# Patient Record
Sex: Female | Born: 1995 | Race: White | Hispanic: No | Marital: Married | Smoking: Never smoker
Health system: Southern US, Community
[De-identification: ages and names within clinical notes are randomized; demographics above are authoritative.]

## PROBLEM LIST (undated history)

## (undated) DIAGNOSIS — O139 Gestational [pregnancy-induced] hypertension without significant proteinuria, unspecified trimester: Secondary | ICD-10-CM

## (undated) DIAGNOSIS — R519 Headache, unspecified: Secondary | ICD-10-CM

## (undated) DIAGNOSIS — R51 Headache: Secondary | ICD-10-CM

## (undated) HISTORY — DX: Headache, unspecified: R51.9

## (undated) HISTORY — DX: Headache: R51

---
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
## 2016-03-03 ENCOUNTER — Ambulatory Visit (INDEPENDENT_AMBULATORY_CARE_PROVIDER_SITE_OTHER): Payer: 59 | Admitting: Physician Assistant

## 2016-03-03 VITALS — BP 112/68 | HR 80 | Temp 97.4°F | Resp 18 | Ht 65.0 in | Wt 160.0 lb

## 2016-03-03 DIAGNOSIS — R51 Headache: Secondary | ICD-10-CM | POA: Diagnosis not present

## 2016-03-03 DIAGNOSIS — R519 Headache, unspecified: Secondary | ICD-10-CM

## 2016-03-03 LAB — POCT CBC
Granulocyte percent: 58 %G (ref 37–80)
HCT, POC: 40.5 % (ref 37.7–47.9)
Hemoglobin: 14.1 g/dL (ref 12.2–16.2)
LYMPH, POC: 2.8 (ref 0.6–3.4)
MCH: 31.6 pg — AB (ref 27–31.2)
MCHC: 34.8 g/dL (ref 31.8–35.4)
MCV: 90.8 fL (ref 80–97)
MID (cbc): 0.6 (ref 0–0.9)
MPV: 7.6 fL (ref 0–99.8)
POC Granulocyte: 4.7 (ref 2–6.9)
POC LYMPH PERCENT: 35.1 %L (ref 10–50)
POC MID %: 6.9 % (ref 0–12)
Platelet Count, POC: 198 10*3/uL (ref 142–424)
RBC: 4.45 M/uL (ref 4.04–5.48)
RDW, POC: 13.4 %
WBC: 8.1 10*3/uL (ref 4.6–10.2)

## 2016-03-03 LAB — GLUCOSE, POCT (MANUAL RESULT ENTRY): POC Glucose: 92 mg/dl (ref 70–99)

## 2016-03-03 MED ORDER — BUTALBITAL-APAP-CAFFEINE 50-325-40 MG PO TABS: 1.0000 | ORAL_TABLET | Freq: Four times a day (QID) | ORAL | Status: AC | PRN

## 2016-03-03 NOTE — Patient Instructions (Addendum)
IF you received an x-ray today, you will receive an invoice from [facility]. Please contact [facility] at [phone] with questions or concerns regarding your invoice.   IF you received labwork today, you will receive an invoice from [employer]. Please contact [facility] at [phone] with questions or concerns regarding your invoice.   Our billing staff will not be able to assist you with questions regarding bills from these companies.  You will be contacted with the lab results as soon as they are available. The fastest way to get your results is to activate your My Chart account. Instructions are located on the last page of this paperwork. If you have not heard from [place] regarding the results in 2 weeks, please contact this office.    Please await contact for neurologist.  I will have your lab results within the next 7-10 days.  General Headache Without Cause A headache is pain or discomfort felt around the head or neck area. The specific cause of a headache may not be found. There are many causes and types of headaches. A few common ones are:  Tension headaches.  Migraine headaches.  Cluster headaches.  Chronic daily headaches. HOME CARE INSTRUCTIONS  Watch your condition for any changes. Take these steps to help with your condition: Managing Pain  Take over-the-counter and prescription medicines only as told by your health care provider.  Lie down in a dark, quiet room when you have a headache.  If directed, apply ice to the head and neck area:  Put ice in a plastic bag.  Place a towel between your skin and the bag.  Leave the ice on for 20 minutes, 2-3 times per day.  Use a heating pad or hot shower to apply heat to the head and neck area as told by your health care provider.  Keep lights dim if bright lights bother you or make your headaches worse. Eating and Drinking  Eat meals on a regular schedule.  Limit  alcohol use.  Decrease the amount of caffeine you drink, or stop drinking caffeine. General Instructions  Keep all follow-up visits as told by your health care provider. This is important.  Keep a headache journal to help find out what may trigger your headaches. For example, write down:  What you eat and drink.  How much sleep you get.  Any change to your diet or medicines.  Try massage or other relaxation techniques.  Limit stress.  Sit up straight, and do not tense your muscles.  Do not use tobacco products, including cigarettes, chewing tobacco, or e-cigarettes. If you need help quitting, ask your health care provider.  Exercise regularly as told by your health care provider.  Sleep on a regular schedule. Get 7-9 hours of sleep, or the amount recommended by your health care provider. SEEK MEDICAL CARE IF:   Your symptoms are not helped by medicine.  You have a headache that is different from the usual headache.  You have nausea or you vomit.  You have a fever. SEEK IMMEDIATE MEDICAL CARE IF:   Your headache becomes severe.  You have repeated vomiting.  You have a stiff neck.  You have a loss of vision.  You have problems with speech.  You have pain in the eye or ear.  You have muscular weakness or loss of muscle control.  You lose your balance or have trouble walking.  You feel faint or pass out.  You have confusion.  This information is not intended to replace advice given to you by your health care provider. Make sure you discuss any questions you have with your health care provider.   Document Released: 07/31/2005 Document Revised: 04/21/2015 Document Reviewed: 11/23/2014 Elsevier Interactive Patient Education [employer]2016 Elsevier Inc.

## 2016-03-03 NOTE — Progress Notes (Signed)
[facility] [address], [place] [address] [phone]  Date:  03/03/2016   Name:  Renee Mcintyre   DOB:  10-Nov-1995   MRN:  [id number]  PCP:  No PCP Per Patient   Chief Complaint  Patient presents with  . Headache    x 1 month   History of Present Illness:  Renee Mcintyre is a 20 y.o. female patient who presents to [facility] for cc of headache for 1 month.    --all day every day.  Shooting pain above her right eye.  Taking excedrin but stopped about 1 week ago because it was not working any more.  No nausea, vision changes.  She wakes up with the headache.  This is only above the right eye.  Feels like a brain freeze.  No photo/phonophobia.   No congestion--no sinus infections. Menses: regular, 5-6 days.  No hx of anemia. Diet: Maxi Bs--salads.  She has meat and vegetables daily.  Drinks water about 64oz of water per day.    --she has had headaches for about a year intermittently but this occurs all day.   --she has dizziness some times.         There are no active problems to display for this patient.   History reviewed. No pertinent past medical history.  History reviewed. No pertinent past surgical history.  Social History  Substance Use Topics  . Smoking status: Never Smoker   . Smokeless tobacco: None  . Alcohol Use: No    Family History  Problem Relation Age of Onset  . Cancer Mother     No Known Allergies  Medication list has been reviewed and updated.  No current outpatient prescriptions on file prior to visit.   No current facility-administered medications on file prior to visit.    ROS   Physical Examination: BP 112/68 mmHg  Pulse 80  Temp(Src) 97.4 F (36.3 C) (Oral)  Resp 18  Ht  (1.651 m)  Wt 160 lb (72.576 kg)  BMI 26.63 kg/m2  SpO2 97%  LMP 02/24/2016 Ideal Body Weight: Weight in (lb) to have BMI = 25: 149.9  Physical Exam  Constitutional: She is oriented to person, place, and time. She appears  well-developed and well-nourished. No distress.  HENT:  Head: Normocephalic and atraumatic.  Right Ear: External ear normal.  Left Ear: External ear normal.  Nose: No mucosal edema or rhinorrhea.  Eyes: Conjunctivae and EOM are normal. Pupils are equal, round, and reactive to light. Right eye exhibits normal extraocular motion and no nystagmus. Left eye exhibits normal extraocular motion and no nystagmus.  Cardiovascular: Normal rate.   Pulmonary/Chest: Effort normal. No respiratory distress.  Neurological: She is alert and oriented to person, place, and time. She has normal strength. No cranial nerve deficit. Coordination and gait normal.  Normal f to n, rapid hand movement.    Skin: She is not diaphoretic.  Psychiatric: She has a normal mood and affect. Her behavior is normal.    Results for orders placed or performed in visit on 03/03/16  COMPLETE METABOLIC PANEL WITH GFR  Result Value Ref Range   Sodium 139 135 - 146 mmol/L   Potassium 4.3 3.5 - 5.3 mmol/L   Chloride 103 98 - 110 mmol/L   CO2 28 20 - 31 mmol/L   Glucose, Bld 85 65 - 99 mg/dL   BUN 14 7 - 25 mg/dL   Creat 2.95 6.21 - 3.08 mg/dL   Total  Bilirubin 0.6 0.2 - 1.2 mg/dL   Alkaline Phosphatase 62 33 - 115 U/L   AST 20 10 - 30 U/L   ALT 14 6 - 29 U/L   Total Protein 7.3 6.1 - 8.1 g/dL   Albumin 4.9 3.6 - 5.1 g/dL   Calcium 9.7 8.6 - 40.910.2 mg/dL   GFR, Est African American >89 >=60 mL/min   GFR, Est Non African American >89 >=60 mL/min  TSH  Result Value Ref Range   TSH 2.44 mIU/L  Sedimentation rate  Result Value Ref Range   Sed Rate 1 0 - 20 mm/hr  POCT CBC  Result Value Ref Range   WBC 8.1 4.6 - 10.2 K/uL   Lymph, poc 2.8 0.6 - 3.4   POC LYMPH PERCENT 35.1 10 - 50 %L   MID (cbc) 0.6 0 - 0.9   POC MID % 6.9 0 - 12 %M   POC Granulocyte 4.7 2 - 6.9   Granulocyte percent 58.0 37 - 80 %G   RBC 4.45 4.04 - 5.48 M/uL   Hemoglobin 14.1 12.2 - 16.2 g/dL   HCT, POC 81.140.5 91.437.7 - 47.9 %   MCV 90.8 80 - 97 fL    MCH, POC 31.6 (A) 27 - 31.2 pg   MCHC 34.8 31.8 - 35.4 g/dL   RDW, POC 78.213.4 %   Platelet Count, POC 198 142 - 424 K/uL   MPV 7.6 0 - 99.8 fL  POCT glucose (manual entry)  Result Value Ref Range   POC Glucose 92 70 - 99 mg/dl   Assessment and Plan: Vidal Schwalbemily N Edwards is a 20 y.o. female who is here today for headache. --possible diet --given fioricet.  Advised to hydrate and eat fresh vegetables.   --will follow up with results.  --if this continues in the next week, should return. Headache, unspecified headache type - Plan: POCT CBC, POCT glucose (manual entry), COMPLETE METABOLIC PANEL WITH GFR, TSH, Sedimentation rate, butalbital-acetaminophen-caffeine (FIORICET) 50-325-40 MG tablet, CANCELED: POCT SEDIMENTATION RATE   Trena PlattStephanie Darchelle Nunes, PA-C [facility] 03/03/2016 6:23 PM

## 2016-03-04 LAB — COMPLETE METABOLIC PANEL WITH GFR
ALBUMIN: 4.9 g/dL (ref 3.6–5.1)
ALK PHOS: 62 U/L (ref 33–115)
ALT: 14 U/L (ref 6–29)
AST: 20 U/L (ref 10–30)
BILIRUBIN TOTAL: 0.6 mg/dL (ref 0.2–1.2)
BUN: 14 mg/dL (ref 7–25)
CO2: 28 mmol/L (ref 20–31)
Calcium: 9.7 mg/dL (ref 8.6–10.2)
Chloride: 103 mmol/L (ref 98–110)
Creat: 0.9 mg/dL (ref 0.50–1.10)
GFR, Est African American: >89 mL/min (ref >=60)
GFR, Est Non African American: >89 mL/min (ref >=60)
Glucose, Bld: 85 mg/dL (ref 65–99)
Potassium: 4.3 mmol/L (ref 3.5–5.3)
SODIUM: 139 mmol/L (ref 135–146)
TOTAL PROTEIN: 7.3 g/dL (ref 6.1–8.1)

## 2016-03-04 LAB — SEDIMENTATION RATE: Sed Rate: 1 mm/hr (ref 0–20)

## 2016-03-04 LAB — TSH: TSH: 2.44 mIU/L

## 2016-03-29 ENCOUNTER — Telehealth: Payer: Self-pay

## 2016-03-29 DIAGNOSIS — R519 Headache, unspecified: Secondary | ICD-10-CM

## 2016-03-29 DIAGNOSIS — R51 Headache: Principal | ICD-10-CM

## 2016-03-29 NOTE — Telephone Encounter (Signed)
Routed to English  

## 2016-03-29 NOTE — Telephone Encounter (Signed)
The patient called to check on her neurology referral for headaches.  I do not see that a referral has been placed.  She saw Trena PlattStephanie English, PA-C on 03/03/16.  Please advise, thank you.  CB#: [phone]

## 2016-03-30 NOTE — Telephone Encounter (Signed)
Please place referral today.  Thank you so much.

## 2016-03-31 NOTE — Telephone Encounter (Signed)
Please review pt labs

## 2016-04-01 NOTE — Telephone Encounter (Signed)
Normal labs were given to patient.

## 2016-04-03 ENCOUNTER — Ambulatory Visit (INDEPENDENT_AMBULATORY_CARE_PROVIDER_SITE_OTHER): Payer: 59 | Admitting: Neurology

## 2016-04-03 ENCOUNTER — Encounter: Payer: Self-pay | Admitting: Neurology

## 2016-04-03 DIAGNOSIS — R51 Headache: Secondary | ICD-10-CM | POA: Diagnosis not present

## 2016-04-03 DIAGNOSIS — G8929 Other chronic pain: Secondary | ICD-10-CM

## 2016-04-03 DIAGNOSIS — R519 Headache, unspecified: Secondary | ICD-10-CM | POA: Insufficient documentation

## 2016-04-03 MED ORDER — SUMATRIPTAN SUCCINATE 50 MG PO TABS: 50.0000 mg | ORAL_TABLET | ORAL | 6 refills | Status: DC | PRN

## 2016-04-03 MED ORDER — NORTRIPTYLINE HCL 10 MG PO CAPS: ORAL_CAPSULE | ORAL | 11 refills | Status: DC

## 2016-04-03 NOTE — Patient Instructions (Signed)
Magnesium oxide 400 mg twice a day Riboflavin  100 mg twice a day 

## 2016-04-03 NOTE — Progress Notes (Signed)
PATIENT: BRELYN WOEHL DOB: [birth year]  Chief Complaint  Patient presents with  . Headache    She is here with her mother, Cristie.  Reports having daily headaches for the last four months. She stopped using OTC NSAIDS but her headaches did not improve.  She was given Fioricet a few weeks ago but it has been only minimally helpful.  She tends to have dizziness with her headaches.     HISTORICAL  RAEL TILLY is 20 years old right-handed female, accompanied by her mother, seen in refer by primary care PA staff English for evaluation of headache, April 03 2016.  She reported a history of headaches since 2016 gradually getting worse since May 2017, her headache described as frontal, vortex area pressure headaches, as if she got a brain freeze, with intermittent sharp transient pain behind right eye, occasionally she has transient dizziness, vertigo with worsening headaches, lasting for few minutes  She was not able to identify any triggers for her headaches, she denies significant nausea, no significant light noise sensitivity.  Over the past few months she has tried warm baths, Tylenol, Advil, Excedrin Migraine with limited relief, she was given prescription of Fioricet since July, she is now taking gradual daily titrating dose, up to 4 tablets a day without improving her symptoms.  She denies focal signs such as visual loss, language difficulty lateralized motor or sensory deficit.  I reviewed laboratory evaluation in July 2017, normal ESR, CBC,TSH, CMP   REVIEW OF SYSTEMS: Full 14 system review of systems performed and notable only for headache, dizziness, sleepiness, spinning sensation, fatigue  ALLERGIES: No Known Allergies  HOME MEDICATIONS: Current Outpatient Prescriptions  Medication Sig Dispense Refill  . butalbital-acetaminophen-caffeine (FIORICET WITH CODEINE) 50-325-40-30 MG capsule Take 1 capsule by mouth every 6 (six) hours as needed for headache.     No  current facility-administered medications for this visit.     PAST MEDICAL HISTORY: Past Medical History:  Diagnosis Date  . Headache     PAST SURGICAL HISTORY: History reviewed. No pertinent surgical history.  FAMILY HISTORY: Family History  Problem Relation Age of Onset  . Cancer Mother     Breast cancer  . Healthy Father     SOCIAL HISTORY:  Social History   Social History  . Marital status: Single    Spouse name: N/A  . Number of children: 0  . Years of education: College   Occupational History  . Student   . Works in Teacher, adult education    Social History Main Topics  . Smoking status: Never Smoker  . Smokeless tobacco: Never Used  . Alcohol use No  . Drug use: No  . Sexual activity: Not on file   Other Topics Concern  . Not on file   Social History Narrative   Lives at home with parents.   Right-handed.   2 sodas per week.     PHYSICAL EXAM   Vitals:   04/03/16 1052  BP: 122/65  Pulse: 71  Weight: 170 lb 4 oz (77.2 kg)  Height: '5\' 5"'$  (1.651 m)    Not recorded      Body mass index is 28.33 kg/m.  PHYSICAL EXAMNIATION:  Gen: NAD, conversant, well nourised, obese, well groomed                     Cardiovascular: Regular rate rhythm, no peripheral edema, warm, nontender. Eyes: Conjunctivae clear without exudates or hemorrhage Neck: Supple, no carotid bruise. Pulmonary: Clear  to auscultation bilaterally   NEUROLOGICAL EXAM:  MENTAL STATUS: Speech:    Speech is normal; fluent and spontaneous with normal comprehension.  Cognition:     Orientation to time, place and person     Normal recent and remote memory     Normal Attention span and concentration     Normal Language, naming, repeating,spontaneous speech     Fund of knowledge   CRANIAL NERVES: CN II: Visual fields are full to confrontation. Fundoscopic exam is normal with sharp discs and no vascular changes. Pupils are round equal and briskly reactive to light. CN III, IV, VI: extraocular  movement are normal. No ptosis. CN V: Facial sensation is intact to pinprick in all 3 divisions bilaterally. Corneal responses are intact.  CN VII: Face is symmetric with normal eye closure and smile. CN VIII: Hearing is normal to rubbing fingers CN IX, X: Palate elevates symmetrically. Phonation is normal. CN XI: Head turning and shoulder shrug are intact CN XII: Tongue is midline with normal movements and no atrophy.  MOTOR: There is no pronator drift of out-stretched arms. Muscle bulk and tone are normal. Muscle strength is normal.  REFLEXES: Reflexes are 2+ and symmetric at the biceps, triceps, knees, and ankles. Plantar responses are flexor.  SENSORY: Intact to light touch, pinprick, positional sensation and vibratory sensation are intact in fingers and toes.  COORDINATION: Rapid alternating movements and fine finger movements are intact. There is no dysmetria on finger-to-nose and heel-knee-shin.    GAIT/STANCE: Posture is normal. Gait is steady with normal steps, base, arm swing, and turning. Heel and toe walking are normal. Tandem gait is normal.  Romberg is absent.   DIAGNOSTIC DATA (LABS, IMAGING, TESTING) - I reviewed patient records, labs, notes, testing and imaging myself where available.   ASSESSMENT AND PLAN  KRISTENE LIBERATI is a 20 y.o. female   Chronic migraine headaches  After discussed with patient, there is no focal signs, no focal complaints, she has normal neurological examination, will hold off further imaging study at this point.  I have suggested preventive medications magnesium oxide, riboflavin,  Also nortriptyline 10 mg titrating to 20 mg every night  Imitrex 25 mg as needed  Return to clinic in 3 months  Marcial Pacas, M.D. Ph.D.  [facility] [address] [place], [place] [address] Ph: [phone] Fax: [phone]  CC: Dorian Heckle [place], [place]

## 2016-07-12 ENCOUNTER — Ambulatory Visit: Payer: 59 | Admitting: Neurology

## 2017-07-30 DIAGNOSIS — Z118 Encounter for screening for other infectious and parasitic diseases: Secondary | ICD-10-CM | POA: Diagnosis not present

## 2017-07-30 DIAGNOSIS — Z01419 Encounter for gynecological examination (general) (routine) without abnormal findings: Secondary | ICD-10-CM | POA: Diagnosis not present

## 2017-07-30 DIAGNOSIS — Z131 Encounter for screening for diabetes mellitus: Secondary | ICD-10-CM | POA: Diagnosis not present

## 2017-08-02 LAB — HM PAP SMEAR: HM Pap smear: NORMAL

## 2017-09-10 ENCOUNTER — Ambulatory Visit (HOSPITAL_COMMUNITY)
Admission: EM | Admit: 2017-09-10 | Discharge: 2017-09-10 | Disposition: A | Discharge status: Home / Self Care | Payer: 59 | Attending: Family Medicine | Admitting: Family Medicine

## 2017-09-10 ENCOUNTER — Encounter (HOSPITAL_COMMUNITY): Payer: Self-pay | Admitting: Emergency Medicine

## 2017-09-10 ENCOUNTER — Ambulatory Visit (INDEPENDENT_AMBULATORY_CARE_PROVIDER_SITE_OTHER): Payer: 59

## 2017-09-10 DIAGNOSIS — R0782 Intercostal pain: Secondary | ICD-10-CM

## 2017-09-10 DIAGNOSIS — R0781 Pleurodynia: Secondary | ICD-10-CM

## 2017-09-10 MED ORDER — MELOXICAM 7.5 MG PO TABS: 7.5000 mg | ORAL_TABLET | Freq: Every day | ORAL | 0 refills | Status: DC

## 2017-09-10 NOTE — ED Provider Notes (Signed)
[facility]    CSN: [id number] Arrival date & time: 09/10/17  1456     History   Chief Complaint Chief Complaint  Patient presents with  . Rib Injury    HPI Renee Mcintyre is a 22 y.o. female.   22 year old female comes in with mother for 3-week history of left rib pain.  Patient states she had a URI symptoms for a month from mid December to mid January.  During the last week of cough, she developed left rib pain.  She states that cough has since resolved, but rib pain continued.  States symptoms worsens with deep breathing and cough.  Denies chest pain, shortness of breath, wheezing.  Denies abdominal pain, nausea, vomiting.   Denies personal or family history of blood clots.  Recently started oral birth control.  Denies swelling of the legs, long travels.       Past Medical History:  Diagnosis Date  . Headache     Patient Active Problem List   Diagnosis Date Noted  . Chronic headache 04/03/2016    History reviewed. No pertinent surgical history.  OB History    No data available       Home Medications    Prior to Admission medications   Medication Sig Start Date End Date Taking? Authorizing Provider  levonorgestrel-ethinyl estradiol (NORDETTE) 0.15-30 MG-MCG tablet Take 1 tablet by mouth daily.   Yes [provider]  spironolactone (ALDACTONE) 25 MG tablet Take 50 mg by mouth daily.   Yes [provider]  butalbital-acetaminophen-caffeine (FIORICET WITH CODEINE) 50-325-40-30 MG capsule Take 1 capsule by mouth every 6 (six) hours as needed for headache.    [provider]  meloxicam (MOBIC) 7.5 MG tablet Take 1 tablet (7.5 mg total) by mouth daily. 09/10/17   Cathie HoopsYu, Arriona Prest V, PA-C  nortriptyline (PAMELOR) 10 MG capsule 1 tablet every night for one week then 2 tablets every night 04/03/16   Levert FeinsteinYan, Yijun, MD  SUMAtriptan (IMITREX) 50 MG tablet Take 1 tablet (50 mg total) by mouth every 2 (two) hours as needed for migraine. May repeat  in 2 hours if headache persists or recurs. 04/03/16   Levert FeinsteinYan, Yijun, MD    Family History Family History  Problem Relation Age of Onset  . Cancer Mother        Breast cancer  . Healthy Father     Social History Social History   Tobacco Use  . Smoking status: Never Smoker  . Smokeless tobacco: Never Used  Substance Use Topics  . Alcohol use: Yes    Comment: occ.   . Drug use: No     Allergies   Patient has no known allergies.   Review of Systems Review of Systems  Reason unable to perform ROS: See HPI as above.     Physical Exam Triage Vital Signs ED Triage Vitals  Enc Vitals Group     BP 09/10/17 1543 136/82     Pulse Rate 09/10/17 1543 94     Resp 09/10/17 1543 16     Temp 09/10/17 1543 98.7 F (37.1 C)     Temp Source 09/10/17 1543 Oral     SpO2 09/10/17 1543 99 %     Weight 09/10/17 1544 179 lb (81.2 kg)     Height 09/10/17 1544 5\' 5"  (1.651 m)     Head Circumference --      [place] Flow --      Pain Score 09/10/17 1543 7  Pain Loc --      Pain Edu? --      Excl. in GC? --    No data found.  Updated Vital Signs BP 136/82   Pulse 94   Temp 98.7 F (37.1 C) (Oral)   Resp 16   Ht 5\' 5"  (1.651 m)   Wt 179 lb (81.2 kg)   LMP 09/01/2017   SpO2 99%   BMI 29.79 kg/m   Physical Exam  Constitutional: She is oriented to person, place, and time. She appears well-developed and well-nourished. No distress.  HENT:  Head: Normocephalic and atraumatic.  Eyes: Conjunctivae are normal. Pupils are equal, round, and reactive to light.  Neck: Normal range of motion. Neck supple.  Cardiovascular: Normal rate, regular rhythm and normal heart sounds. Exam reveals no gallop and no friction rub.  No murmur heard. Pulmonary/Chest: Effort normal and breath sounds normal. She has no wheezes. She has no rales.  No rash, erythema, contusion seen. Tenderness to palpation of anterior ribs inferior to the breast.  Abdominal: Soft. Bowel sounds are normal. She exhibits no  distension. There is no tenderness. There is no rebound and no guarding.  Musculoskeletal:  No tenderness on palpation of the back/flank. Full ROM.   Neurological: She is alert and oriented to person, place, and time.  Skin: Skin is warm and dry.     UC Treatments / Results  Labs (all labs ordered are listed, but only abnormal results are displayed) Labs Reviewed - No data to display  EKG  EKG Interpretation None       Radiology Dg Ribs Unilateral W/chest Left  Result Date: 09/10/2017 CLINICAL DATA:  Left upper anterior rib pain for 1 month.  Cough. EXAM: LEFT RIBS AND CHEST - 3+ VIEW COMPARISON:  None. FINDINGS: No fracture or other bone lesions are seen involving the ribs. There is no evidence of pneumothorax or pleural effusion. Both lungs are clear. Heart size and mediastinal contours are within normal limits. IMPRESSION: Negative. Electronically Signed   By: Charlett Nose M.D.   On: 09/10/2017 16:43    Procedures Procedures (including critical care time)  Medications Ordered in UC Medications - No data to display   Initial Impression / Assessment and Plan / UC Course  I have reviewed the triage vital signs and the nursing notes.  Pertinent labs & imaging results that were available during my care of the patient were reviewed by me and considered in my medical decision making (see chart for details).    Xray negative for fractures.  Patient with lungs clear to auscultation bilaterally without adventitious lung sounds, O2 saturation at 99%.  She is afebrile, without tachycardia or tachypnea.  Wells score of 0.  Will have patient start Mobic for pain.  Patient to follow-up with PCP if symptoms not improving.  Return precautions given.  Patient and mother expresses understanding and agrees to plan.  Final Clinical Impressions(s) / UC Diagnoses   Final diagnoses:  Rib pain on left side    ED Discharge Orders        Ordered    meloxicam (MOBIC) 7.5 MG tablet  Daily      09/10/17 1706        Belinda Fisher, PA-C 09/10/17 1710

## 2017-09-10 NOTE — ED Triage Notes (Signed)
PT reports URI symptoms from mid December to mid January. PT reports mid left rib pain that started 3 weeks ago.

## 2017-09-10 NOTE — Discharge Instructions (Signed)
X-ray negative for fracture.  Start Mobic as directed for pain.  Ice/warm compress as needed.  If symptoms not improving in the next 1-2 weeks, follow-up with PCP for reevaluation.  If noticing worsening symptoms, shortness of breath, chest pain, one-sided leg swelling with redness, go to the emergency department for further evaluation.

## 2017-11-09 DIAGNOSIS — Z0000 Encounter for general adult medical examination without abnormal findings: Secondary | ICD-10-CM | POA: Diagnosis not present

## 2017-11-21 ENCOUNTER — Encounter: Payer: Self-pay | Admitting: Physician Assistant

## 2018-09-11 DIAGNOSIS — Z0000 Encounter for general adult medical examination without abnormal findings: Secondary | ICD-10-CM | POA: Diagnosis not present

## 2018-09-11 DIAGNOSIS — Z01419 Encounter for gynecological examination (general) (routine) without abnormal findings: Secondary | ICD-10-CM | POA: Diagnosis not present

## 2018-09-11 DIAGNOSIS — E282 Polycystic ovarian syndrome: Secondary | ICD-10-CM | POA: Diagnosis not present

## 2018-09-11 LAB — BASIC METABOLIC PANEL
BUN: 9 (ref 4–21)
Creatinine: 1 (ref <=1.1)
Glucose: 97
Potassium: 5.3 (ref 3.4–5.3)
Sodium: 139 (ref 137–147)

## 2018-09-11 LAB — HEPATIC FUNCTION PANEL
ALT: 18 (ref 7–35)
AST: 15 (ref 13–35)
Alkaline Phosphatase: 76 (ref 25–125)
Bilirubin, Total: 0.2

## 2018-10-02 DIAGNOSIS — Z3041 Encounter for surveillance of contraceptive pills: Secondary | ICD-10-CM | POA: Diagnosis not present

## 2018-10-02 DIAGNOSIS — E282 Polycystic ovarian syndrome: Secondary | ICD-10-CM | POA: Diagnosis not present

## 2019-05-26 ENCOUNTER — Ambulatory Visit (INDEPENDENT_AMBULATORY_CARE_PROVIDER_SITE_OTHER): Payer: 59 | Admitting: Family Medicine

## 2019-05-26 ENCOUNTER — Encounter: Payer: Self-pay | Admitting: Family Medicine

## 2019-05-26 ENCOUNTER — Other Ambulatory Visit: Payer: Self-pay

## 2019-05-26 VITALS — BP 133/80 | HR 107 | Temp 99.0°F | Ht 65.0 in | Wt 244.8 lb

## 2019-05-26 DIAGNOSIS — N912 Amenorrhea, unspecified: Secondary | ICD-10-CM

## 2019-05-26 DIAGNOSIS — R635 Abnormal weight gain: Secondary | ICD-10-CM | POA: Diagnosis not present

## 2019-05-26 NOTE — Progress Notes (Signed)
New Patient Office Visit  Subjective:  Patient ID: Renee Mcintyre, female    DOB: 11-26-1995  Age: 23 y.o. MRN: [id number]  CC:  Chief Complaint  Patient presents with  . Establish Care  . Weight Loss    HPI Renee Mcintyre presents for concerns about weight gain-BCP pills-pt took for 2 years. Pt has been off of medication x 1 month.   Pt with no prior h/o of weight gain.  Pt has tried protein shakes, Med diet, liquid diet, increase exercise. Labwork at [facility] in [place] started for weight loss-PCOS, Spirolactone and Levothyroxine-"slightly abnormal TSH".  No prior diagnosis of thyroid disease. Pt states 100 lb weight gain over the past 3 years with more over the past year.  Pt states stress level has improved with job instead of college and married 3/20.  Pt states job is primarily sitting with exercise 52mins prior to work- home exercise program-3 days week and after work -30-45 min dance work out.  Pt states decrease exercise recently-within the last month.  03/03/16-weight 160lb, BMI 26.63. LMP(04-19-19) 1 month ago-no period last week-pt came off BCP.   Regular periods on BCP.  No nipple discharge.  No skipped periods in the past.  Weight gain noted prior to starting BCP.  Eating patterns have NOT changed other than multiple diets to try to lose weight.  Pt states salty foods her downfall-once on weekends she and here husband eat fast food. Pt cooks her own food during the week. Pt primarily bakes her food.  Pt loves veggies and salads.  Pt drinks water primarily. Occasionally tea or soda 1-2 /week.    Past Medical History:  Diagnosis Date  . Headache    Family History  Problem Relation Age of Onset  . Cancer Mother        Breast cancer  . 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 Marital status: Married    Spouse name: Not on file  . Number of children: 0  . Years of education: College  . Highest  education level: Not on file  Occupational History  . Occupation: Risk analyst  Social Needs  . Financial resource strain: Not on file  . Food insecurity    Worry: Not on file    Inability: Not on file  . Transportation needs    Medical: Not on file    Non-medical: Not on file  Tobacco Use  . Smoking status: Never Smoker  . Smokeless tobacco: Never Used  Substance and Sexual Activity  . Alcohol use: Yes    Comment: occ.   . Drug use: No  . Sexual activity: Yes  Lifestyle  . Physical activity    Days per week: Not on file    Minutes per session: Not on file  . Stress: Not on file  Relationships  . Social Herbalist on phone: Not on file    Gets together: Not on file    Attends religious service: Not on file    Active member of club or organization: Not on file    Attends meetings of clubs or organizations: Not on file    Relationship status: Not on file  . Intimate partner violence    Fear of current or ex partner: Not on file    Emotionally abused: Not on file    Physically abused: Not on file    Forced sexual activity: Not on  file  Other Topics Concern  . Not on file  Social History Narrative   Lives at home with parents.   Right-handed.   2 sodas per week.    ROS Review of Systems  Constitutional: Positive for fatigue. Negative for activity change, appetite change and fever.  HENT: Negative.   Respiratory: Negative.        Breathing harder with exercise  Cardiovascular: Negative.   Gastrointestinal: Negative.   Endocrine: Negative.  Negative for cold intolerance, heat intolerance and polyuria.  Genitourinary:       Normal pap smear   Musculoskeletal: Negative.   Skin: Negative.   Allergic/Immunologic: Negative for environmental allergies.  Neurological:       Vertigo-increase with stress-sleep helps  Hematological: Negative.   Psychiatric/Behavioral: Negative.     Objective:   Today's Vitals: BP 133/80 (BP Location: Right Arm, Patient  Position: Sitting, Cuff Size: Normal)   Pulse (!) 107   Temp 99 F (37.2 C) (Oral)   Ht 5\' 5"  (1.651 m)   Wt 244 lb 12.8 oz (111 kg)   SpO2 97%   BMI 40.74 kg/m   Physical Exam Constitutional:      Appearance: She is obese.  HENT:     Head: Normocephalic and atraumatic.     Right Ear: Tympanic membrane, ear canal and external ear normal.     Left Ear: Tympanic membrane, ear canal and external ear normal.     Nose: Nose normal.     Mouth/Throat:     Mouth: Mucous membranes are moist.  Eyes:     Conjunctiva/sclera: Conjunctivae normal.  Neck:     Musculoskeletal: Normal range of motion and neck supple.  Cardiovascular:     Rate and Rhythm: Normal rate and regular rhythm.     Pulses: Normal pulses.     Heart sounds: Normal heart sounds.  Pulmonary:     Effort: Pulmonary effort is normal.     Breath sounds: Normal breath sounds.  Abdominal:     General: Abdomen is flat.  Skin:    General: Skin is warm.     Comments: striae  Neurological:     Mental Status: She is alert and oriented to person, place, and time.  Psychiatric:        Mood and Affect: Mood normal.        Behavior: Behavior normal.     Assessment & Plan:   1. Weight gain, abnormal Obtain lab records from OB [facility]-cortisol, creatine, HCG, prolactin, TSH/T4(off medications for 1 month) Outpatient Encounter Medications as of 05/26/2019  Medication Sig  . [DISCONTINUED] butalbital-acetaminophen-caffeine (FIORICET WITH CODEINE) 50-325-40-30 MG capsule Take 1 capsule by mouth every 6 (six) hours as needed for headache.  . [DISCONTINUED] levonorgestrel-ethinyl estradiol (NORDETTE) 0.15-30 MG-MCG tablet Take 1 tablet by mouth daily.  . [DISCONTINUED] meloxicam (MOBIC) 7.5 MG tablet Take 1 tablet (7.5 mg total) by mouth daily. (Patient not taking: Reported on 05/26/2019)  . [DISCONTINUED] nortriptyline (PAMELOR) 10 MG capsule 1 tablet every night for one week then 2 tablets every night  (Patient not taking: Reported on 05/26/2019)  . [DISCONTINUED] spironolactone (ALDACTONE) 25 MG tablet Take 50 mg by mouth daily.  . [DISCONTINUED] SUMAtriptan (IMITREX) 50 MG tablet Take 1 tablet (50 mg total) by mouth every 2 (two) hours as needed for migraine. May repeat in 2 hours if headache persists or recurs. (Patient not taking: Reported on 05/26/2019)   No facility-administered encounter medications on file as of 05/26/2019.  Follow-up: Endo labwork Gerber Penza Mat Carne, MD

## 2019-05-26 NOTE — Patient Instructions (Signed)
Endo referral  Previously labwork Will call if labs required before follow up appt

## 2019-06-03 LAB — CORTISOL, FREE 24 HR URINE
Cortisol (Ur), Free: 24.1 mcg/24 h (ref 4.0–50.0)
Cortisol, Free ratio to CRT: 16.7 mcg/g creat
RESULTS RECEIVED: 1.44 g/(24.h) (ref 0.50–2.15)
Total Volume: 2300 mL

## 2019-06-03 LAB — TSH+FREE T4: TSH W/REFLEX TO FT4: 3.28 mIU/L

## 2019-06-03 LAB — HCG, SERUM, QUALITATIVE: Preg, Serum: NEGATIVE

## 2019-06-03 LAB — PROLACTIN: Prolactin: 9.2 ng/mL

## 2019-06-03 LAB — CREATININE, URINE, 24 HOUR: Creatinine, 24H Ur: 1.47 g/(24.h) (ref 0.50–2.15)

## 2019-06-10 ENCOUNTER — Encounter: Payer: Self-pay | Admitting: "Endocrinology

## 2019-06-10 ENCOUNTER — Ambulatory Visit (INDEPENDENT_AMBULATORY_CARE_PROVIDER_SITE_OTHER): Payer: 59 | Admitting: "Endocrinology

## 2019-06-10 ENCOUNTER — Other Ambulatory Visit: Payer: Self-pay

## 2019-06-10 NOTE — Patient Instructions (Signed)

## 2019-06-10 NOTE — Progress Notes (Signed)
Endocrinology Consult Note                                            06/10/2019, 5:58 PM   Subjective:    Patient ID: Renee Mcintyre, female    DOB: [birth year], PCP Corum, Minerva Fester, MD   Past Medical History:  Diagnosis Date  . Headache    History reviewed. No pertinent surgical history. Social History   Socioeconomic History  . Marital status: Married    Spouse name: Not on file  . Number of children: 0  . Years of education: College  . Highest education level: Not on file  Occupational History  . Occupation: Primary school teacher  Social Needs  . Financial resource strain: Not on file  . Food insecurity    Worry: Not on file    Inability: Not on file  . Transportation needs    Medical: Not on file    Non-medical: Not on file  Tobacco Use  . Smoking status: Never Smoker  . Smokeless tobacco: Never Used  Substance and Sexual Activity  . Alcohol use: Yes    Comment: occ.   . Drug use: No  . Sexual activity: Yes  Lifestyle  . Physical activity    Days per week: Not on file    Minutes per session: Not on file  . Stress: Not on file  Relationships  . Social Musician on phone: Not on file    Gets together: Not on file    Attends religious service: Not on file    Active member of club or organization: Not on file    Attends meetings of clubs or organizations: Not on file    Relationship status: Not on file  Other Topics Concern  . Not on file  Social History Narrative   Lives at home with parents.   Right-handed.   2 sodas per week.   Family History  Problem Relation Age of Onset  . Cancer Mother        Breast cancer  . Healthy Father    No outpatient encounter medications on file as of 06/10/2019.   No facility-administered encounter medications on file as of 06/10/2019.    ALLERGIES: No Known Allergies  VACCINATION STATUS:  There is no immunization history on file for this patient.  HPI Renee Mcintyre is 23 y.o. female who  presents today with a medical history as above. she is being seen in consultation for weight management requested by Wandra Feinstein, MD.   History is obtained directly from the patient.  She denies any prior history of endocrine dysfunction.  She reports progressive weight gain.  She admits to dietary indiscretions including consumption of large quantities of sweets, sweetened beverages, as well as sweetened fruit juices. She has not tried any weight loss measures yet.  She has seen her weight dramatically increased over the last 3 years after she finished college.  Her recent work-up is negative for Cushing's syndrome, thyroid dysfunction. She does not have family history of adrenal morbidly dysfunction.  She has thyroid problem in her grandparents. She is not particularly active. She does not have children yet, that is her plan for long-term care  Review of Systems  Constitutional: + Progressive weight gain, +fatigue, no subjective hyperthermia, no subjective hypothermia Eyes: no blurry vision, no xerophthalmia  ENT: no sore throat, no nodules palpated in throat, no dysphagia/odynophagia, no hoarseness Cardiovascular: no Chest Pain, no Shortness of Breath, no palpitations, no leg swelling Respiratory: no cough, no shortness of breath Gastrointestinal: no Nausea/Vomiting/Diarhhea Musculoskeletal: no muscle/joint aches Skin: no rashes Neurological: no tremors, no numbness, no tingling, no dizziness Psychiatric: no depression, no anxiety  Objective:    Wt 244 lb (110.7 kg)   BMI 40.60 kg/m   Wt Readings from Last 3 Encounters:  06/10/19 244 lb (110.7 kg)  05/26/19 244 lb 12.8 oz (111 kg)  09/10/17 179 lb (81.2 kg)    Physical Exam  Constitutional:  Body mass index is 40.6 kg/m.,  not in acute distress, normal state of mind Eyes: PERRLA, EOMI, no exophthalmos ENT: moist mucous membranes, no gross thyromegaly, no gross cervical lymphadenopathy Cardiovascular: normal precordial  activity, Regular Rate and Rhythm, no Murmur/Rubs/Gallops Respiratory:  adequate breathing efforts, no gross chest deformity, Clear to auscultation bilaterally Gastrointestinal: abdomen soft, Non -tender, No distension, Bowel Sounds present, no gross organomegaly Musculoskeletal: no gross deformities, strength intact in all four extremities Skin: moist, warm, no rashes, +  purplish striae on abdominal skin. Neurological: no tremor with outstretched hands, Deep tendon reflexes normal in bilateral lower extremities.  CMP ( most recent) CMP     Component Value Date/Time   NA 139 03/03/2016 1858   K 4.3 03/03/2016 1858   CL 103 03/03/2016 1858   CO2 28 03/03/2016 1858   GLUCOSE 85 03/03/2016 1858   BUN 14 03/03/2016 1858   CREATININE 0.90 03/03/2016 1858   CALCIUM 9.7 03/03/2016 1858   PROT 7.3 03/03/2016 1858   ALBUMIN 4.9 03/03/2016 1858   AST 20 03/03/2016 1858   ALT 14 03/03/2016 1858   ALKPHOS 62 03/03/2016 1858   BILITOT 0.6 03/03/2016 1858   GFRNONAA >89 03/03/2016 1858   GFRAA >89 03/03/2016 1858    Recent Results (from the past 2160 hour(s))  Cortisol, Free 24 HR Urine     Status: None   Collection Time: 05/28/19  1:08 PM  Result Value Ref Range   Total Volume 2,300 mL   Cortisol (Ur), Free 24.1 4.0 - 50.0 mcg/24 h    Comment: . Analysis performed by [employer] [facility] [employer]    Cortisol, Free ratio to CRT 16.7 mcg/g creat    Comment:                                       Reference Range:                                       ADULTS: 3.1-42.3    RESULTS RECEIVED 1.44 0.50 - 2.15 g/24 h    Comment: . This test was developed and its analytical performance characteristics have been determined by [facility]. It has not been cleared or approved by FDA. This assay has been validated pursuant to the CLIA regulations and is used for clinical purposes. .   TSH + free T4     Status: None   Collection Time: 05/28/19  1:08 PM   Result Value Ref Range   TSH W/REFLEX TO FT4 3.28 mIU/L    Comment:           Reference Range .           >  or = 20 Years  0.40-4.50 .                Pregnancy Ranges           First trimester    0.26-2.66           Second trimester   0.55-2.73           Third trimester    0.43-2.91   Prolactin     Status: None   Collection Time: 05/28/19  1:08 PM  Result Value Ref Range   Prolactin 9.2 ng/mL    Comment:             Reference Range  Females         Non-pregnant        3.0-30.0         Pregnant           10.0-209.0         Postmenopausal      2.0-20.0 . . .   Creatinine, Urine, 24 Hour     Status: None   Collection Time: 05/28/19  1:08 PM  Result Value Ref Range   Creatinine, 24H Ur 1.47 0.50 - 2.15 g/24 h  hCG, serum, qualitative     Status: None   Collection Time: 05/28/19  1:08 PM  Result Value Ref Range   Preg, Serum NEGATIVE     Comment: Reference Range Non-Pregnant: Negative Pregnant:     Positive .      Lab Results  Component Value Date   TSH 2.44 03/03/2016       Assessment & Plan:   1. Morbid obesity (HCC) Starting from December 2014:  Weight measurements include 136 pounds, 160 pounds, 170 pounds, 2044 pounds today.  Her progressive weight gain is likely due to excessive caloric intake rather than endogenous endocrine dysfunction.   She will benefit the most from dietary modification. I had a long discussion with her about steps that she can take to help her lose weight in a progressive manner. - she  admits there is a room for improvement in her diet and drink choices. -  Suggestion is made for her to avoid simple carbohydrates  from her diet including Cakes, Sweet Desserts / Pastries, Ice Cream, Soda (diet and regular), Sweet Tea, Candies, Chips, Cookies, Sweet Pastries,  Store Bought Juices, Alcohol in Excess of  1-2 drinks a day, Artificial Sweeteners, Coffee Creamer, and "Sugar-free" Products. This will help patient to have stable blood glucose  profile and potentially avoid unintended weight gain. She will return in 3 months for reevaluation.  If there is no satisfactory movement, she will be considered for Saxenda.  -Exercise regiment with longer walks is advised to the patient. -She will return in 3 months with repeat labs including thyroid function test, vitamin D.  She will also be sent to dietary consult.  - I did not initiate any new prescriptions today. - she is advised to maintain close follow up with Corum, Minerva FesterLisa L, MD for primary care needs.   - Time spent with the patient: 60 minutes, of which >50% was spent in obtaining information about her symptoms, reviewing her previous labs/studies,  evaluations, and treatments, counseling her about her morbid obesity, and developing a plan to confirm the diagnosis and long term treatment based on the latest standards of care/guidelines.    Renee CampsEmily N Rollo participated in the discussions, expressed understanding, and voiced agreement with the above plans.  All questions were answered to  her satisfaction. she is encouraged to contact clinic should she have any questions or concerns prior to her return visit.  Follow up plan: Return in about 3 months (around 09/10/2019) for Follow up with Pre-visit Labs.   Glade Lloyd, MD [facility] [address] [place], [place] [address] Phone: [phone]  Fax: [phone]     06/10/2019, 5:58 PM  This note was partially dictated with voice recognition software. Similar sounding words can be transcribed inadequately or may not  be corrected upon review.

## 2019-06-10 NOTE — Progress Notes (Signed)
Pt states she stopped birth control 2 months ago. She also states that she was on Levothyroxine (unsure of dosage) which was prescribed by OB-GYN. She stopped this 1 month ago. TSH & Free T4 done by Dr Holly Bodily were WNL.. She states she has gained approx. 80lbs over the past 3 yrs.

## 2019-06-17 ENCOUNTER — Encounter: Payer: 59 | Attending: "Endocrinology | Admitting: Nutrition

## 2019-06-17 VITALS — Ht 65.0 in | Wt 241.0 lb

## 2019-06-17 NOTE — Patient Instructions (Signed)
Goals  Follow My Plate Eat 3 balanced meals  Drink only water- 5 bottles per day Eat breakfast daily. Lose 1-2 lbs per week. Exercise: 45 minutes 3- times per week.

## 2019-06-17 NOTE — Progress Notes (Signed)
  Medical Nutrition Therapy:  Appt start time: 4081 end time:  1630.   Assessment:  Primary concerns today: Obesity and weight gain. Married and lives with husband. Is a Corporate treasurer.   Sees Dr. Dorris Fetch, Endocrinoloty. Thinks she has gained 80 lbs in the last 3 years. Eating 3 meals a day now and was eating 2 before seing Dr. Dorris Fetch. History of poor eating habits. No physically active but willing to improve.  CMP Latest Ref Rng & Units 09/11/2018 03/03/2016  Glucose 65 - 99 mg/dL - 85  BUN 4 - 21 9 14   Creatinine 0.5 - 1.1 1.0 0.90  Sodium 137 - 147 139 139  Potassium 3.4 - 5.3 5.3 4.3  Chloride 98 - 110 mmol/L - 103  CO2 20 - 31 mmol/L - 28  Calcium 8.6 - 10.2 mg/dL - 9.7  Total Protein 6.1 - 8.1 g/dL - 7.3  Total Bilirubin 0.2 - 1.2 mg/dL - 0.6  Alkaline Phos 25 - 125 76 62  AST 13 - 35 15 20  ALT 7 - 35 18 14   Wt Readings from Last 3 Encounters:  06/17/19 241 lb (109.3 kg)  06/10/19 244 lb (110.7 kg)  05/26/19 244 lb 12.8 oz (111 kg)   Ht Readings from Last 3 Encounters:  06/17/19 5\' 5"  (1.651 m)  05/26/19 5\' 5"  (1.651 m)  09/10/17 5\' 5"  (1.651 m)   Body mass index is 40.1 kg/m. @BMIFA @ Facility age limit for growth percentiles is 20 years. Facility age limit for growth percentiles is 20 years.    Physical activity:.   Preferred Learning Style  No preference indicated   Learning Readiness:  Ready  Change in progress   MEDICATIONS:    DIETARY INTAKE: .     Was eating 2 meals per day . Admits to processed food and junk food. Doesn't cook much. Ate out a lot.  Usual physical activity: ADL  Estimated energy needs: 1200 calories 135 g carbohydrates 90 g protein 33 g fat  Progress Towards Goal(s):  In progress.   Nutritional Diagnosis:  NB-1.1 Food and nutrition-related knowledge deficit As related to Obesity.  As evidenced by BMI  40.    Intervention: Nutrition and Weight loss education provided on My Plate, CHO counting, meal planning, ports,  timing of meals, avoiding snacks g medications as prescribed, benefits of exercising  60 minutes per day and prevention of DM. Stressed cutting out processed foods, fried foods and fast foods. Encouraged to cook more from home and increase fresh fruits and vegetables.   Goals  Follow My Plate Eat 3 balanced meals  Drink only water- 5 bottles per day Eat breakfast daily. Lose 1-2 lbs per week. Exercise: 45 minutes 3- times per week.  Teaching Method Utilized:  Visual Auditory Hands on  Handouts given during visit include:  The Plate Method  Meal Plan  Weight loss instructions   Barriers to learning/adherence to lifestyle change: none  Demonstrated degree of understanding via:  Teach Back   Monitoring/Evaluation:  Dietary intake, exercise, , and body weight in 1 month(s).

## 2019-07-14 ENCOUNTER — Encounter: Payer: Self-pay | Admitting: Nutrition

## 2019-07-28 ENCOUNTER — Telehealth: Payer: 59 | Admitting: Nutrition

## 2019-08-20 ENCOUNTER — Telehealth: Payer: 59 | Admitting: Nutrition

## 2019-08-21 ENCOUNTER — Other Ambulatory Visit: Payer: Self-pay

## 2019-08-21 ENCOUNTER — Ambulatory Visit: Payer: 59 | Attending: Internal Medicine

## 2019-08-21 DIAGNOSIS — Z20822 Contact with and (suspected) exposure to covid-19: Secondary | ICD-10-CM

## 2019-08-23 LAB — NOVEL CORONAVIRUS, NAA: SARS-CoV-2, NAA: NOT DETECTED

## 2019-09-04 ENCOUNTER — Other Ambulatory Visit: Payer: Self-pay

## 2019-09-04 ENCOUNTER — Ambulatory Visit: Payer: 59 | Attending: Internal Medicine

## 2019-09-04 DIAGNOSIS — Z20822 Contact with and (suspected) exposure to covid-19: Secondary | ICD-10-CM

## 2019-09-05 LAB — NOVEL CORONAVIRUS, NAA: SARS-CoV-2, NAA: NOT DETECTED

## 2019-09-10 ENCOUNTER — Ambulatory Visit: Payer: 59 | Admitting: "Endocrinology

## 2020-01-19 IMAGING — DX DG RIBS W/ CHEST 3+V*L*
3 series · 3 of 3 positions shown · non-contrast
Comparison: None.

CLINICAL DATA: Left upper anterior rib pain for 1 month.  Cough.

EXAM:
LEFT RIBS AND CHEST - 3+ VIEW

[chest pa]
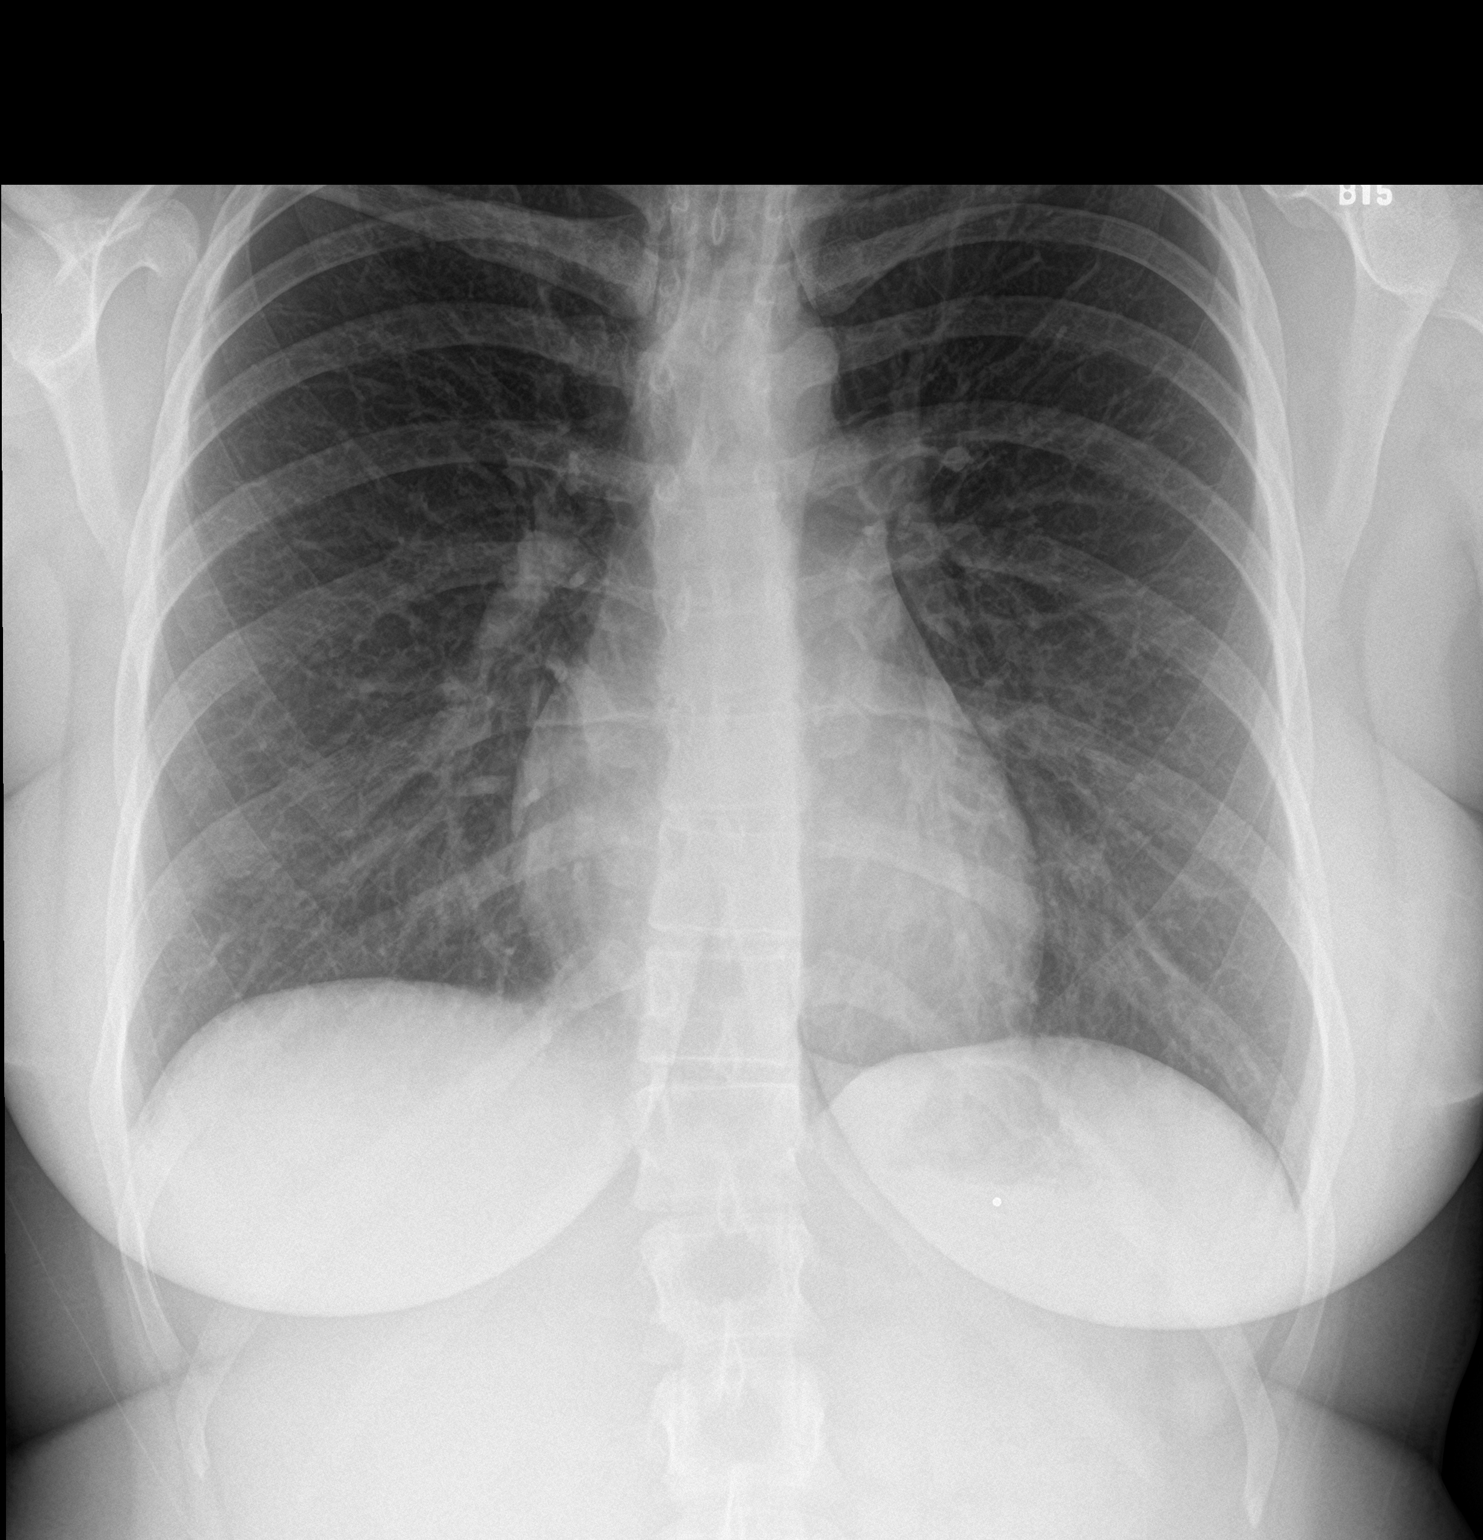

[rib obl]
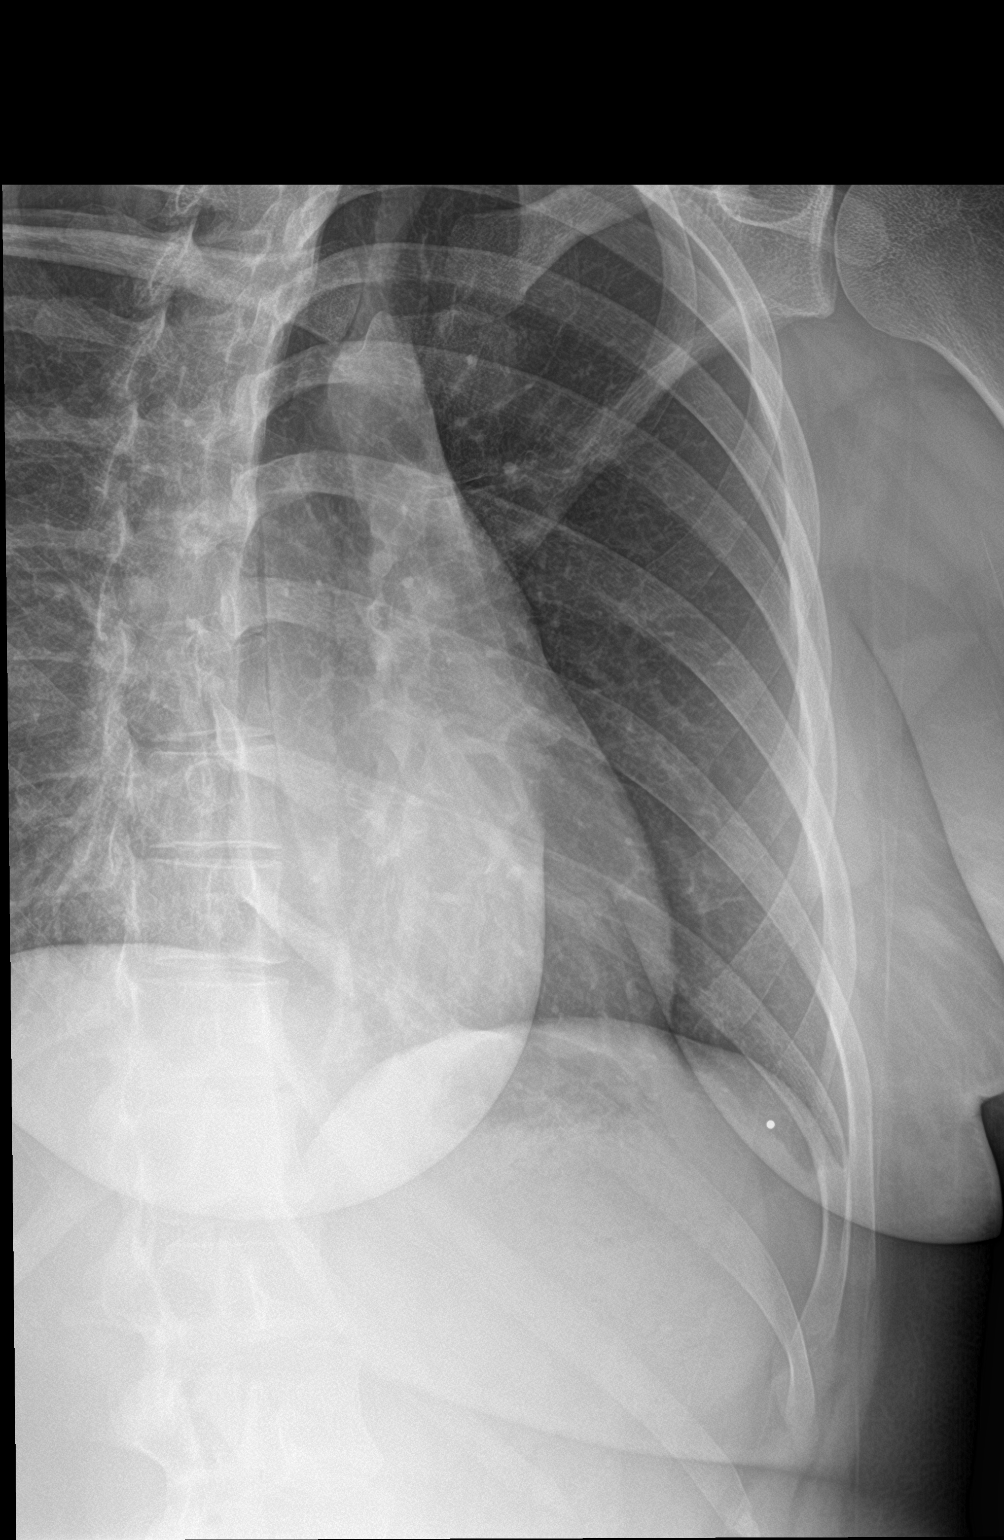

[rib pa]
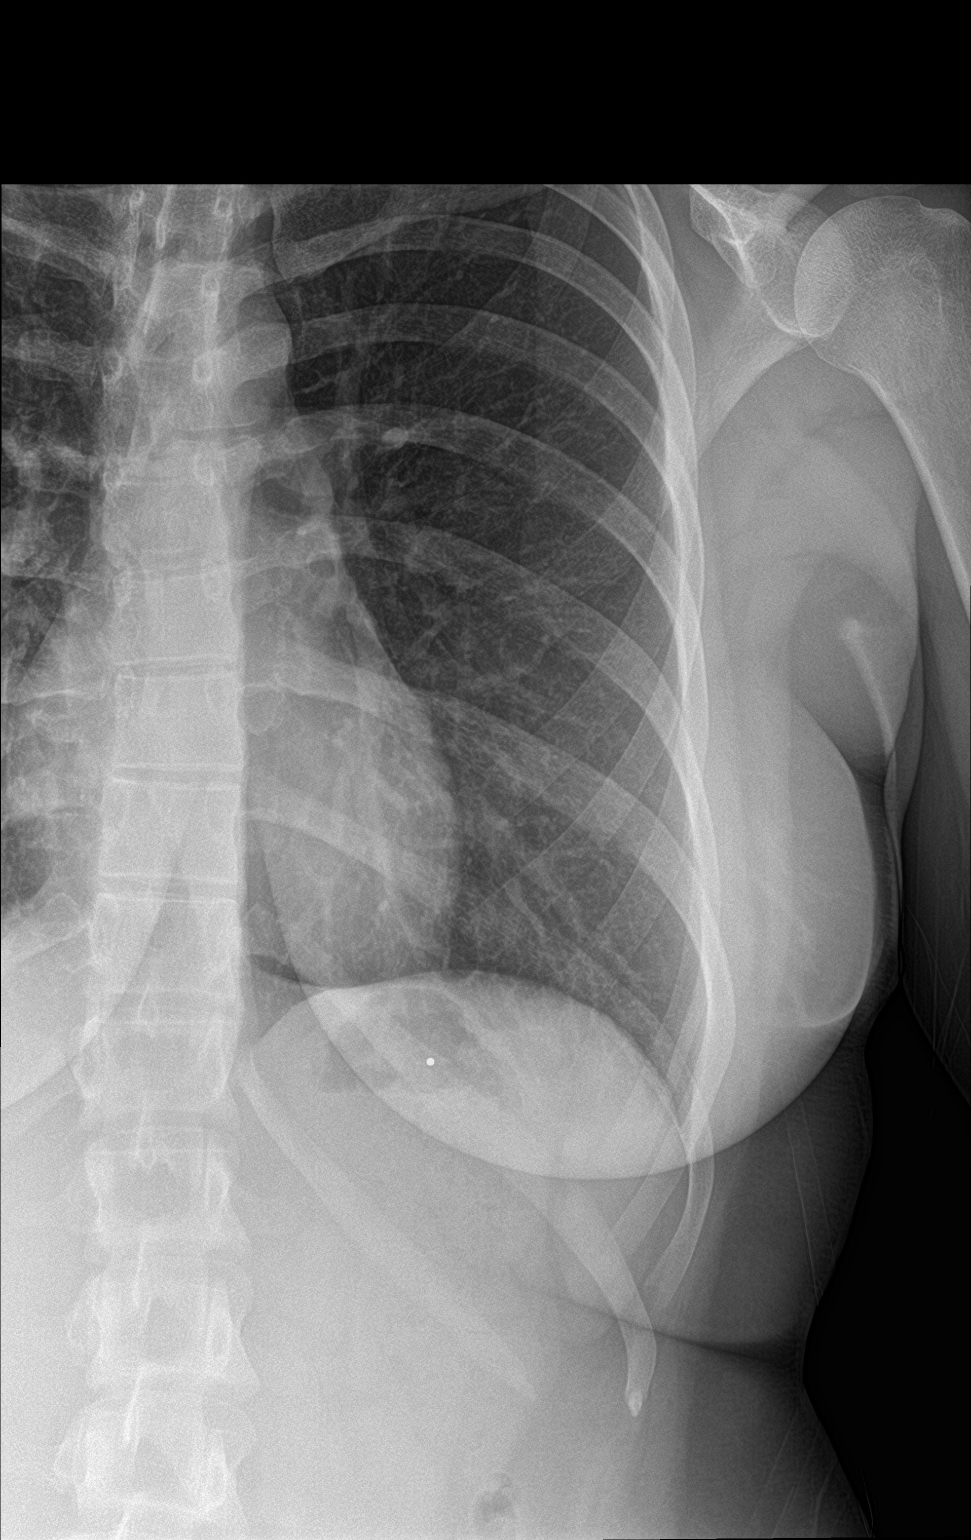

[3 of 3 positions shown; findings below may reference images not displayed]

FINDINGS: No fracture or other bone lesions are seen involving the ribs. There
is no evidence of pneumothorax or pleural effusion. Both lungs are
clear. Heart size and mediastinal contours are within normal limits.
IMPRESSION: Negative.

## 2020-11-10 ENCOUNTER — Inpatient Hospital Stay (HOSPITAL_COMMUNITY): Admission: AD | Admit: 2020-11-10 | Payer: 59 | Source: Home / Self Care | Admitting: Obstetrics

## 2021-02-11 ENCOUNTER — Encounter (HOSPITAL_COMMUNITY): Payer: Self-pay | Admitting: Obstetrics & Gynecology

## 2021-02-11 ENCOUNTER — Inpatient Hospital Stay (HOSPITAL_COMMUNITY)
Admission: AD | Admit: 2021-02-11 | Discharge: 2021-02-22 | DRG: 788 | Disposition: A | Discharge status: Home / Self Care | Payer: 59 | Attending: Obstetrics and Gynecology | Admitting: Obstetrics and Gynecology

## 2021-02-11 ENCOUNTER — Other Ambulatory Visit: Payer: Self-pay

## 2021-02-11 DIAGNOSIS — O1414 Severe pre-eclampsia complicating childbirth: Principal | ICD-10-CM | POA: Diagnosis present

## 2021-02-11 DIAGNOSIS — O99892 Other specified diseases and conditions complicating childbirth: Secondary | ICD-10-CM | POA: Diagnosis present

## 2021-02-11 DIAGNOSIS — O1413 Severe pre-eclampsia, third trimester: Secondary | ICD-10-CM | POA: Diagnosis present

## 2021-02-11 DIAGNOSIS — O99213 Obesity complicating pregnancy, third trimester: Secondary | ICD-10-CM | POA: Diagnosis not present

## 2021-02-11 DIAGNOSIS — H3500 Unspecified background retinopathy: Secondary | ICD-10-CM | POA: Diagnosis present

## 2021-02-11 DIAGNOSIS — Z23 Encounter for immunization: Secondary | ICD-10-CM

## 2021-02-11 DIAGNOSIS — R030 Elevated blood-pressure reading, without diagnosis of hypertension: Secondary | ICD-10-CM | POA: Diagnosis present

## 2021-02-11 DIAGNOSIS — Z6791 Unspecified blood type, Rh negative: Secondary | ICD-10-CM

## 2021-02-11 DIAGNOSIS — H5462 Unqualified visual loss, left eye, normal vision right eye: Secondary | ICD-10-CM | POA: Diagnosis present

## 2021-02-11 DIAGNOSIS — O26899 Other specified pregnancy related conditions, unspecified trimester: Secondary | ICD-10-CM

## 2021-02-11 DIAGNOSIS — E669 Obesity, unspecified: Secondary | ICD-10-CM | POA: Diagnosis not present

## 2021-02-11 DIAGNOSIS — O26892 Other specified pregnancy related conditions, second trimester: Secondary | ICD-10-CM | POA: Diagnosis present

## 2021-02-11 DIAGNOSIS — Z3A28 28 weeks gestation of pregnancy: Secondary | ICD-10-CM | POA: Diagnosis not present

## 2021-02-11 DIAGNOSIS — R000 Tachycardia, unspecified: Secondary | ICD-10-CM | POA: Diagnosis present

## 2021-02-11 DIAGNOSIS — O99212 Obesity complicating pregnancy, second trimester: Secondary | ICD-10-CM | POA: Diagnosis not present

## 2021-02-11 DIAGNOSIS — O99214 Obesity complicating childbirth: Secondary | ICD-10-CM | POA: Diagnosis present

## 2021-02-11 DIAGNOSIS — Z3A27 27 weeks gestation of pregnancy: Secondary | ICD-10-CM

## 2021-02-11 DIAGNOSIS — O1412 Severe pre-eclampsia, second trimester: Secondary | ICD-10-CM | POA: Diagnosis not present

## 2021-02-11 DIAGNOSIS — O283 Abnormal ultrasonic finding on antenatal screening of mother: Secondary | ICD-10-CM | POA: Diagnosis not present

## 2021-02-11 DIAGNOSIS — Z98891 History of uterine scar from previous surgery: Secondary | ICD-10-CM

## 2021-02-11 LAB — CBC
HCT: 40.3 % (ref 36.0–46.0)
Hemoglobin: 13.3 g/dL (ref 12.0–15.0)
MCH: 30.2 pg (ref 26.0–34.0)
MCHC: 33 g/dL (ref 30.0–36.0)
MCV: 91.6 fL (ref 80.0–100.0)
Platelets: 220 10*3/uL (ref 150–400)
RBC: 4.4 MIL/uL (ref 3.87–5.11)
RDW: 14.6 % (ref 11.5–15.5)
WBC: 10.8 10*3/uL — ABNORMAL HIGH (ref 4.0–10.5)
nRBC: 0 % (ref 0.0–0.2)

## 2021-02-11 LAB — COMPREHENSIVE METABOLIC PANEL
ALT: 15 U/L (ref 0–44)
AST: 18 U/L (ref 15–41)
Albumin: 2.8 g/dL — ABNORMAL LOW (ref 3.5–5.0)
Alkaline Phosphatase: 77 U/L (ref 38–126)
Anion gap: 8 (ref 5–15)
BUN: 6 mg/dL (ref 6–20)
CO2: 22 mmol/L (ref 22–32)
Calcium: 9.2 mg/dL (ref 8.9–10.3)
Chloride: 107 mmol/L (ref 98–111)
Creatinine, Ser: 0.86 mg/dL (ref 0.44–1.00)
GFR, Estimated: >60 mL/min (ref >60)
Glucose, Bld: 99 mg/dL (ref 70–99)
Potassium: 4.3 mmol/L (ref 3.5–5.1)
Sodium: 137 mmol/L (ref 135–145)
Total Bilirubin: 0.5 mg/dL (ref 0.3–1.2)
Total Protein: 5.9 g/dL — ABNORMAL LOW (ref 6.5–8.1)

## 2021-02-11 LAB — URINALYSIS, ROUTINE W REFLEX MICROSCOPIC
Bacteria, UA: NONE SEEN
Bilirubin Urine: NEGATIVE
Glucose, UA: NEGATIVE mg/dL
Ketones, ur: NEGATIVE mg/dL
Leukocytes,Ua: NEGATIVE
Nitrite: NEGATIVE
Protein, ur: 100 mg/dL — AB
Specific Gravity, Urine: 1.003 — ABNORMAL LOW (ref 1.005–1.030)
pH: 7 (ref 5.0–8.0)

## 2021-02-11 LAB — PROTEIN / CREATININE RATIO, URINE
Creatinine, Urine: 20.95 mg/dL
Protein Creatinine Ratio: 10.07 mg/mg{Cre} — ABNORMAL HIGH (ref 0.00–0.15)
Total Protein, Urine: 211 mg/dL

## 2021-02-11 MED ORDER — HYDRALAZINE HCL 20 MG/ML IJ SOLN
10.0000 mg | INTRAMUSCULAR | Status: DC | PRN
  Administered 2021-02-11: 10 mg via INTRAVENOUS
  Filled 2021-02-11 (×2): qty 1

## 2021-02-11 MED ORDER — ONDANSETRON 4 MG PO TBDP
8.0000 mg | ORAL_TABLET | Freq: Three times a day (TID) | ORAL | Status: DC | PRN
  Administered 2021-02-11 – 2021-02-17 (×4): 8 mg via ORAL
  Filled 2021-02-11 (×5): qty 2

## 2021-02-11 MED ORDER — LABETALOL HCL 5 MG/ML IV SOLN
80.0000 mg | INTRAVENOUS | Status: DC | PRN
  Administered 2021-02-11: 80 mg via INTRAVENOUS
  Filled 2021-02-11: qty 16

## 2021-02-11 MED ORDER — HYDRALAZINE HCL 20 MG/ML IJ SOLN
10.0000 mg | INTRAMUSCULAR | Status: DC | PRN
  Administered 2021-02-14 (×2): 10 mg via INTRAVENOUS
  Filled 2021-02-11 (×2): qty 1

## 2021-02-11 MED ORDER — HYDRALAZINE HCL 20 MG/ML IJ SOLN
10.0000 mg | Freq: Once | INTRAMUSCULAR | Status: AC
  Administered 2021-02-11: 10 mg via INTRAVENOUS

## 2021-02-11 MED ORDER — HYDRALAZINE HCL 20 MG/ML IJ SOLN: 10.0000 mg | INTRAMUSCULAR | Status: DC | PRN

## 2021-02-11 MED ORDER — LABETALOL HCL 5 MG/ML IV SOLN: 80.0000 mg | INTRAVENOUS | Status: DC | PRN

## 2021-02-11 MED ORDER — BETAMETHASONE SOD PHOS & ACET 6 (3-3) MG/ML IJ SUSP
12.0000 mg | INTRAMUSCULAR | Status: AC
  Administered 2021-02-11 – 2021-02-12 (×2): 12 mg via INTRAMUSCULAR
  Filled 2021-02-11: qty 5

## 2021-02-11 MED ORDER — MAGNESIUM SULFATE 40 GM/1000ML IV SOLN
2.0000 g/h | INTRAVENOUS | Status: AC
  Administered 2021-02-11 – 2021-02-13 (×3): 2 g/h via INTRAVENOUS
  Filled 2021-02-11 (×3): qty 1000

## 2021-02-11 MED ORDER — LABETALOL HCL 5 MG/ML IV SOLN: 40.0000 mg | INTRAVENOUS | Status: DC | PRN

## 2021-02-11 MED ORDER — PRENATAL MULTIVITAMIN CH
1.0000 | ORAL_TABLET | Freq: Every day | ORAL | Status: DC
  Administered 2021-02-12 – 2021-02-16 (×5): 1 via ORAL
  Filled 2021-02-11 (×5): qty 1

## 2021-02-11 MED ORDER — LABETALOL HCL 200 MG PO TABS
200.0000 mg | ORAL_TABLET | Freq: Two times a day (BID) | ORAL | Status: DC
  Administered 2021-02-11 – 2021-02-12 (×3): 200 mg via ORAL
  Filled 2021-02-11 (×3): qty 1

## 2021-02-11 MED ORDER — LABETALOL HCL 5 MG/ML IV SOLN
20.0000 mg | INTRAVENOUS | Status: DC | PRN
  Administered 2021-02-11 – 2021-02-15 (×4): 20 mg via INTRAVENOUS
  Filled 2021-02-11 (×2): qty 4

## 2021-02-11 MED ORDER — CALCIUM CARBONATE ANTACID 500 MG PO CHEW: 2.0000 | CHEWABLE_TABLET | ORAL | Status: DC | PRN

## 2021-02-11 MED ORDER — BUTALBITAL-APAP-CAFFEINE 50-325-40 MG PO TABS
2.0000 | ORAL_TABLET | ORAL | Status: DC | PRN
  Administered 2021-02-11 – 2021-02-17 (×3): 2 via ORAL
  Filled 2021-02-11 (×3): qty 2

## 2021-02-11 MED ORDER — MAGNESIUM SULFATE BOLUS VIA INFUSION
4.0000 g | Freq: Once | INTRAVENOUS | Status: AC
  Administered 2021-02-11: 4 g via INTRAVENOUS
  Filled 2021-02-11: qty 1000

## 2021-02-11 MED ORDER — LABETALOL HCL 5 MG/ML IV SOLN: 20.0000 mg | INTRAVENOUS | Status: DC | PRN

## 2021-02-11 MED ORDER — ZOLPIDEM TARTRATE 5 MG PO TABS: 5.0000 mg | ORAL_TABLET | Freq: Every evening | ORAL | Status: DC | PRN

## 2021-02-11 MED ORDER — LABETALOL HCL 5 MG/ML IV SOLN
20.0000 mg | INTRAVENOUS | Status: DC | PRN
  Administered 2021-02-11 (×2): 20 mg via INTRAVENOUS
  Filled 2021-02-11 (×5): qty 4

## 2021-02-11 MED ORDER — LABETALOL HCL 5 MG/ML IV SOLN
80.0000 mg | INTRAVENOUS | Status: DC | PRN
  Administered 2021-02-14 – 2021-02-15 (×2): 80 mg via INTRAVENOUS
  Filled 2021-02-11 (×2): qty 16

## 2021-02-11 MED ORDER — LABETALOL HCL 5 MG/ML IV SOLN
40.0000 mg | INTRAVENOUS | Status: DC | PRN
  Administered 2021-02-14 – 2021-02-15 (×2): 40 mg via INTRAVENOUS
  Filled 2021-02-11: qty 8

## 2021-02-11 MED ORDER — LABETALOL HCL 5 MG/ML IV SOLN
40.0000 mg | INTRAVENOUS | Status: DC | PRN
  Administered 2021-02-11: 40 mg via INTRAVENOUS
  Filled 2021-02-11 (×2): qty 8

## 2021-02-11 MED ORDER — LACTATED RINGERS IV SOLN: INTRAVENOUS | Status: DC

## 2021-02-11 MED ORDER — DOCUSATE SODIUM 100 MG PO CAPS
100.0000 mg | ORAL_CAPSULE | Freq: Every day | ORAL | Status: DC
  Administered 2021-02-12 – 2021-02-13 (×2): 100 mg via ORAL
  Filled 2021-02-11 (×5): qty 1

## 2021-02-11 MED ORDER — ACETAMINOPHEN 325 MG PO TABS
650.0000 mg | ORAL_TABLET | ORAL | Status: DC | PRN
  Administered 2021-02-11 – 2021-02-14 (×4): 650 mg via ORAL
  Filled 2021-02-11 (×4): qty 2

## 2021-02-11 NOTE — MAU Note (Signed)
Has elevated BP 190/120 at OB office, 180/120 at home.  Has lost vision in left eye, yesterday- mid day.   Has had a HA for a couple of days. Took a couple Tylenol, no relief.  Denies epigastric pain, states swelling has gone down.

## 2021-02-11 NOTE — H&P (Signed)
Renee Mcintyre is Renee 25 y.o. female G1P0 [redacted]w[redacted]d presenting for new onset severe range blood pressures and complaints of vision loss. Patient reports increased swelling in her legs and hands over the past 1-2 weeks. Started having Renee migraine Renee couple of days ago, had taken Tylenol which alleviated her pain. However this morning she started losing vision in her left eye. Seen in the office this morning where she was found to have BP of 172/100 and 192/120 and was sent directly to hospital.  Since arriving to the hospital patient has had continued vision symptoms from her left eye only. Denies any bright spots, dark spots, or flashing lights. Denies any current HA. No CP or SOB. No RUQ/epigastric pain besides the acid reflux symptoms which she has had throughout the pregnancy and controlled with Pepcid. She has felt good movement from baby. Denies any CTXs, VB, or LOF.  History significant for PCOS infertility. Pregnancy conceived on Femara, dated by LMP c/w 7w [place]. Routine PNC at [facility] with Renee Mcintyre as her primary provider. She has had normal prenatal labs. Low risk Panorama predicting female fetus. AFP screen was negative and Anatomy US WNL and EFW at 84% at that time (18w). She had an early 1hr GTT given history of PCOS and obesity which was 124, but has not yet had 3rd trimester labs. Patient had been normotensive during pregnancy until today and no history of HTN or need for antihypertensives in the past. Some remote history of subclinical hypothyroidism diagnosed during infertility treatments but no medications and normal TSH in this pregnancy.   OB History     Gravida  1   Para      Term      Preterm      AB      Living         SAB      IAB      Ectopic      Multiple      Live Births             Past Medical History:  Diagnosis Date   Headache    History reviewed. No pertinent surgical history. Family History: family history includes Cancer in her mother; Healthy in her  father. Social History:  reports that she has never smoked. She has never used smokeless tobacco. She reports current alcohol use. She reports that she does not use drugs.     Maternal Diabetes: No Genetic Screening: Normal Maternal Ultrasounds/Referrals: Normal Fetal Ultrasounds or other Referrals:  None Maternal Substance Abuse:  No Significant Maternal Medications:  None Significant Maternal Lab Results:  None Other Comments:  None  Review of Systems Per HPI Exam Physical Exam    Blood pressure (!) 168/100, pulse 94, temperature 99.2 F (37.3 C), temperature source Oral, resp. rate 18, height 5\' 5"  (1.651 m), weight 118.1 kg, SpO2 98 %.  Physical Exam: General: AAO, NAD Heart: RRR, no audible murmurs Lungs: CTABL no rales, no crackles, no wheezes Abdomen: gravid uterus, obese, no RUQ pain Ext: trace LE edema b/l Neuro: CN grossly intact, +3 b/l LE DTR's  Fetal Assessment: -Cat I reassuring for gestational age baseline 140 bpm mod var no decels -Acontractile on tocometry   Prenatal labs: ABO, Rh:   O NEG Antibody:   Rubella:  Immune RPR:   NR HBsAg:   NEG HIV:   NR GBS:     Chemistry Recent Labs  Lab 02/11/21 1247  NA 137  K 4.3  CL  107  CO2 22  GLUCOSE 99  BUN 6  CREATININE 0.86  CALCIUM 9.2  GFRNONAA >60  ANIONGAP 8    Recent Labs  Lab 02/11/21 1247  PROT 5.9*  ALBUMIN 2.8*  AST 18  ALT 15  ALKPHOS 77  BILITOT 0.5   Hematology Recent Labs  Lab 02/11/21 1247  WBC 10.8*  RBC 4.40  HGB 13.3  HCT 40.3  MCV 91.6  MCH 30.2  MCHC 33.0  RDW 14.6  PLT 220   Urine P:Cr Ratio: 10.07   Assessment/Plan: Renee Mcintyre is Renee 25 y.o. female G1P0 [redacted]w[redacted]d admitted with new onset preeclampsia with severe features by blood pressures.   Discussed with patient and her husband at bedside findings consistent with diagnosis of preeclampsia with severe features given blood pressure and significant proteinuria. We reviewed clinical significance as well  as maternal and fetal risks. Therefore recommend inpatient admission for close monitoring and BP control with plan of care as follows.  Pre-Eclampsia with severe range blood pressure -BP control: s/p IV Labetalol protocol x1 with some temporary improvement of BP but back to severe range now. Will initiate IV Hydralazine protocol and return to Labetalol if needed. PO Labetalol 200 mg BID ordered, will likely need additional oral agents but will titrate as indicated -PIH labs WNL with exception of PCR 10, will repeat labs in AM or sooner as indicated -Magnesium seizure ppx initiated with 4 g bolus followed by 2 g/hr maintenance. Will continue for at least 24 hours then reassess pending BP control and patient status -BMZ #1 given for fetal lung maturity, plan for 2nd dose tmw 7/2 @ 1445 -Growth [place] ordered for AM with MFM consultation -Will keep on continuous EFM/Toco while on Magnesium- reassuring for GA -Normal visual field testing by MAU providers, monitor for improvement with improvement of BP's consider Ophtho consult if persistent  Antepartum care -Clear liquid diet until BP better controlled -SCD VTE ppx -PNV and bowel regimen daily -Will need 3rd trimester GDM screening once BP's stable and 3-4 days out from BMZ to avoid false positive -Routine antepartum care   Renee Mcintyre Renee Mcintyre 02/11/2021, 4:54 PM

## 2021-02-11 NOTE — MAU Provider Note (Signed)
History     CSN: [id number]  Arrival date and time: 02/11/21 1227   Event Date/Time   First Provider Initiated Contact with Patient 02/11/21 1323      Chief Complaint  Patient presents with   Headache   Hypertension   Loss of Vision   25 y.o. G1 @27 .5 wks sent from office for HA, elevated BP, and vision loss. Reports onset of HA 2 days ago. HA located occipital, rates 4/10. She took Tylenol yesterday but it didn't help. HA is better today than yesterday. Reports loss of peripheral vision in left eye yesterday. Denies blurry vision or floaters. BP at office was 192/120. No hx of HTN. Reports good FM. No pregnancy complaints.    OB History     Gravida  1   Para      Term      Preterm      AB      Living         SAB      IAB      Ectopic      Multiple      Live Births              Past Medical History:  Diagnosis Date   Headache     History reviewed. No pertinent surgical history.  Family History  Problem Relation Age of Onset   Cancer Mother        Breast cancer   Healthy Father     Social History   Tobacco Use   Smoking status: Never   Smokeless tobacco: Never  Vaping Use   Vaping Use: Never used  Substance Use Topics   Alcohol use: Yes    Comment: occ.    Drug use: No    Allergies: No Known Allergies  No medications prior to admission.    Review of Systems  Eyes:  Positive for visual disturbance.  Respiratory:  Negative for shortness of breath.   Cardiovascular:  Negative for chest pain and leg swelling (feet).  Gastrointestinal:  Negative for abdominal pain.  Genitourinary:  Negative for vaginal bleeding.  Neurological:  Positive for headaches.  Physical Exam   Blood pressure (!) 153/88, pulse 90, temperature 99.2 F (37.3 C), temperature source Oral, resp. rate 18, height 5\' 5"  (1.651 m), weight 118.1 kg, SpO2 98 %. Patient Vitals for the past 24 hrs:  BP Temp Temp src Pulse Resp SpO2 Height Weight  02/11/21 1401 (!)  153/88 -- -- 90 -- -- -- --  02/11/21 1341 (!) 160/90 -- -- 89 -- -- -- --  02/11/21 1327 (!) 168/101 -- -- 87 18 -- -- --  02/11/21 1314 (!) 178/103 -- -- 91 18 -- -- --  02/11/21 1306 (!) 172/114 -- -- -- 18 -- -- --  02/11/21 1247 (!) 192/109 99.2 F (37.3 C) Oral 98 18 98 % 5\' 5"  (1.651 m) 118.1 kg    Physical Exam Vitals and nursing note reviewed.  Constitutional:      General: She is not in acute distress.    Appearance: Normal appearance.  HENT:     Head: Normocephalic and atraumatic.  Eyes:     General: Lids are normal. Gaze aligned appropriately. No visual field deficit.    Extraocular Movements: Extraocular movements intact.     Pupils: Pupils are equal, round, and reactive to light.  Cardiovascular:     Rate and Rhythm: Normal rate and regular rhythm.     Heart sounds: Normal heart sounds.  Pulmonary:     Effort: Pulmonary effort is normal. No respiratory distress.     Breath sounds: Normal breath sounds. No stridor. No wheezing, rhonchi or rales.  Musculoskeletal:        General: No swelling. Normal range of motion.     Cervical back: Normal range of motion.  Skin:    General: Skin is warm and dry.  Neurological:     General: No focal deficit present.     Mental Status: She is alert and oriented to person, place, and time.  Psychiatric:        Mood and Affect: Mood normal.        Behavior: Behavior normal.  EFM: 135 bpm, mod variability, + accels, no decels Toco: none  Results for orders placed or performed during the hospital encounter of 02/11/21 (from the past 24 hour(s))  CBC     Status: Abnormal   Collection Time: 02/11/21 12:47 PM  Result Value Ref Range   WBC 10.8 (H) 4.0 - 10.5 K/uL   RBC 4.40 3.87 - 5.11 MIL/uL   Hemoglobin 13.3 12.0 - 15.0 g/dL   HCT 78.2 42.3 - 53.6 %   MCV 91.6 80.0 - 100.0 fL   MCH 30.2 26.0 - 34.0 pg   MCHC 33.0 30.0 - 36.0 g/dL   RDW 14.4 31.5 - 40.0 %   Platelets 220 150 - 400 K/uL   nRBC 0.0 0.0 - 0.2 %  Comprehensive  metabolic panel     Status: Abnormal   Collection Time: 02/11/21 12:47 PM  Result Value Ref Range   Sodium 137 135 - 145 mmol/L   Potassium 4.3 3.5 - 5.1 mmol/L   Chloride 107 98 - 111 mmol/L   CO2 22 22 - 32 mmol/L   Glucose, Bld 99 70 - 99 mg/dL   BUN 6 6 - 20 mg/dL   Creatinine, Ser 8.67 0.44 - 1.00 mg/dL   Calcium 9.2 8.9 - 61.9 mg/dL   Total Protein 5.9 (L) 6.5 - 8.1 g/dL   Albumin 2.8 (L) 3.5 - 5.0 g/dL   AST 18 15 - 41 U/L   ALT 15 0 - 44 U/L   Alkaline Phosphatase 77 38 - 126 U/L   Total Bilirubin 0.5 0.3 - 1.2 mg/dL   GFR, Estimated >50 >93 mL/min   Anion gap 8 5 - 15   MAU Course  Procedures Labetalol 20, 40, 80  MDM Labs ordered. 1339: Dr. Conni Elliot notified of presentation and clinical findings, Labetalol x2 doses given, labs pending, plan for admit. Discussed plan with pt and spouse.   Assessment and Plan  [redacted] weeks gestation Reactive NST Severe preeclampsia Admit to [facility] unit  Mngt per Dr. Dorisann Frames, CNM 02/11/2021, 2:11 PM

## 2021-02-12 ENCOUNTER — Inpatient Hospital Stay (HOSPITAL_BASED_OUTPATIENT_CLINIC_OR_DEPARTMENT_OTHER): Payer: 59

## 2021-02-12 DIAGNOSIS — O99212 Obesity complicating pregnancy, second trimester: Secondary | ICD-10-CM

## 2021-02-12 DIAGNOSIS — Z3A27 27 weeks gestation of pregnancy: Secondary | ICD-10-CM

## 2021-02-12 DIAGNOSIS — O1412 Severe pre-eclampsia, second trimester: Secondary | ICD-10-CM | POA: Diagnosis not present

## 2021-02-12 DIAGNOSIS — O283 Abnormal ultrasonic finding on antenatal screening of mother: Secondary | ICD-10-CM

## 2021-02-12 DIAGNOSIS — E669 Obesity, unspecified: Secondary | ICD-10-CM

## 2021-02-12 LAB — CBC WITH DIFFERENTIAL/PLATELET
Abs Immature Granulocytes: 0.09 10*3/uL — ABNORMAL HIGH (ref 0.00–0.07)
Basophils Absolute: 0 10*3/uL (ref 0.0–0.1)
Basophils Relative: 0 %
Eosinophils Absolute: 0 10*3/uL (ref 0.0–0.5)
Eosinophils Relative: 0 %
HCT: 39 % (ref 36.0–46.0)
Hemoglobin: 13.2 g/dL (ref 12.0–15.0)
Immature Granulocytes: 1 %
Lymphocytes Relative: 13 %
Lymphs Abs: 1.9 10*3/uL (ref 0.7–4.0)
MCH: 31.1 pg (ref 26.0–34.0)
MCHC: 33.8 g/dL (ref 30.0–36.0)
MCV: 92 fL (ref 80.0–100.0)
Monocytes Absolute: 0.3 10*3/uL (ref 0.1–1.0)
Monocytes Relative: 2 %
Neutro Abs: 12.2 10*3/uL — ABNORMAL HIGH (ref 1.7–7.7)
Neutrophils Relative %: 84 %
Platelets: 242 10*3/uL (ref 150–400)
RBC: 4.24 MIL/uL (ref 3.87–5.11)
RDW: 15.3 % (ref 11.5–15.5)
WBC: 14.6 10*3/uL — ABNORMAL HIGH (ref 4.0–10.5)
nRBC: 0 % (ref 0.0–0.2)

## 2021-02-12 LAB — COMPREHENSIVE METABOLIC PANEL
ALT: 15 U/L (ref 0–44)
AST: 21 U/L (ref 15–41)
Albumin: 2.7 g/dL — ABNORMAL LOW (ref 3.5–5.0)
Alkaline Phosphatase: 74 U/L (ref 38–126)
Anion gap: 12 (ref 5–15)
BUN: 8 mg/dL (ref 6–20)
CO2: 20 mmol/L — ABNORMAL LOW (ref 22–32)
Calcium: 7.9 mg/dL — ABNORMAL LOW (ref 8.9–10.3)
Chloride: 101 mmol/L (ref 98–111)
Creatinine, Ser: 0.95 mg/dL (ref 0.44–1.00)
GFR, Estimated: >60 mL/min (ref >60)
Glucose, Bld: 110 mg/dL — ABNORMAL HIGH (ref 70–99)
Potassium: 4.2 mmol/L (ref 3.5–5.1)
Sodium: 133 mmol/L — ABNORMAL LOW (ref 135–145)
Total Bilirubin: 0.2 mg/dL — ABNORMAL LOW (ref 0.3–1.2)
Total Protein: 5.9 g/dL — ABNORMAL LOW (ref 6.5–8.1)

## 2021-02-12 NOTE — Progress Notes (Addendum)
Renee Mcintyre 25 y.o. G1P0 at [redacted]w[redacted]d HD#2 admitted with new onset severe pre-eclampsia  S: Pt doing better this morning. States HA that developed later yesterday has since completely resolved. Endorses residual left sided vision loss but not worsening, no scotoma, no additional changes. Had a brief episode of chest tightness yesterday afternoon a/w tachycardia, s/p normal EKG, and has since resolved. Denies any pressure, palpitations, dyspnea, or SOB. Denies any RUQ/epigastric pain. Tolerated breakfast this morning, no N/V. Has been feeling a lot of movement from baby. Denies any CTXs, VB, or LOF.  O: Vitals:   02/12/21 0324 02/12/21 0833 02/12/21 0907 02/12/21 1122  BP: 137/85 134/81  138/75  Pulse: 98 89  91  Resp: 18  18 17   Temp: 98.4 F (36.9 C)  97.7 F (36.5 C)   TempSrc: Oral  Oral Oral  SpO2: 98%  99% 98%  Weight:      Height:        Intake/Output Summary (Last 24 hours) at 02/12/2021 1203 Last data filed at 02/12/2021 1100 Gross per 24 hour  Intake 2277.25 ml  Output 2550 ml  Net -272.75 ml     Physical Exam General: AAO, NAD Heart: RRR, no audible murmurs Lungs: CTABL no rales no crackles no wheezes Abdomen: gravid uterus, non-tender to palpation Ext: trace b/l LE edema  Fetal Monitoring EFM: cat I baseline 120 bpm mod var +10x10 accels, no decels Toco: acontractile  Cephalic on 04/15/2021 today- final report pending  CBC Latest Ref Rng & Units 02/12/2021 02/11/2021 03/03/2016  WBC 4.0 - 10.5 K/uL 14.6(H) 10.8(H) 8.1  Hemoglobin 12.0 - 15.0 g/dL 03/05/2016 28.3 15.1  Hematocrit 36.0 - 46.0 % 39.0 40.3 40.5  Platelets 150 - 400 K/uL 242 220 -   CMP Latest Ref Rng & Units 02/12/2021 02/11/2021 09/11/2018  Glucose 70 - 99 mg/dL 09/13/2018) 99 -  BUN 6 - 20 mg/dL 8 6 9   Creatinine 0.44 - 1.00 mg/dL 607(P 1.0  Sodium 7.10 - 145 mmol/L 133(L) 137 139  Potassium 3.5 - 5.1 mmol/L 4.2 4.3 5.3  Chloride 98 - 111 mmol/L 101 107 -  CO2 22 - 32 mmol/L 20(L) 22 -  Calcium 8.9 - 10.3 mg/dL  7.9(L) 9.2 -  Total Protein 6.5 - 8.1 g/dL 5.9(L) 5.9(L) -  Total Bilirubin 0.3 - 1.2 mg/dL 6.26) 0.5 -  Alkaline Phos 38 - 126 U/L 74 77 76  AST 15 - 41 U/L 21 18 15   ALT 0 - 44 U/L 15 15 18      A/P: 948 25 y.o. G1P0 at [redacted]w[redacted]d HD#2 admitted with new onset severe pre-eclampsia, now on Magnesium, clinically stable, with improvement of BP control  Pre-Eclampsia with severe range blood pressure -BP control: s/p multiple runs of IV labetalol and hydralazine protocol on admission. No additional IV pushes overnight or this morning. BP's most recently in the 130s/70-80s on PO Labetalol 200 BID, will continue this regimen and titrate as indicated  -PIH labs WNL with exception of PCR 10 on admission. Repeat labs this morning stable, slight rise in Cr to 0.95, however unchanged from historical Cr of 1.0 in 2020. Will repeat in AM or sooner as needed -Magnesium seizure ppx initiated with 4 g bolus followed by 2 g/hr maintenance. Will continue for at least 24 hours, DC at 1430 if BP's remain stable -S/p BMZ #1, plan for 2nd dose today @ 1445 -MFM Growth done at bedside this morning- noted cephalic presentation, but final report/recs pending -  Will keep on continuous EFM/Toco while on Magnesium- reassuring for GA- then transition to q shift  Antepartum care -Transitioned to regular diet -SCD VTE ppx for now, consider transition to Lovenox if pt remains stable and prolonged admission likely -PNV and bowel regimen daily -Will need 3rd trimester GDM screening once BP's stable and 3-4 days out from BMZ to avoid false positive -RH NEG, will need Rhogam pending NEG Ab screen- will add to tomorrow's labs -Routine antepartum care   Renee Mcintyre A Renee Mcintyre 02/12/21 11:59 AM  ADDENDUM:  After speaking with MFM Dr. Parke Poisson, will continue the Magnesium seizure ppx until BMZ complete- 24hrs after 2nd dose received this afternoon. Therefore will keep continuous EFM/Toco while Mag is on. Will repeat PIH  labs twice weekly. Modified BPP once weekly. NICU consult placed. Discussed AGA fetus in cephalic presentation on today's [place] with no signs of growth restriction.  Renee Mcintyre A Renee Mcintyre 02/12/21 1:36 PM

## 2021-02-12 NOTE — Consult Note (Signed)
Neonatology Consult Note:  At the request of the patients obstetrician Dr. Lanny Cramp I met with Renee Mcintyre who is a 25 year old G1P0 currently at 27 weeks and 6 days who was admitted last evening due to severe preeclampsia.  Pregnancy complicated by PCOS infertility. Pregnancy conceived on Femara, dated by LMP c/w 7w [place].  She was started on magnesium sulfate for maternal seizure prophylaxis and given a course of antenatal corticosteroids.    We discussed morbidity/mortality at this gestional age, delivery room resuscitation, including intubation and surfactant in DR.  Discussed mechanical ventilation and risk for chronic lung disease, risk for IVH with potential for motor / cognitive deficits, ROP, NEC, sepsis, as well as temperature instability and feeding immaturity.  Discussed NG / OG feeds, benefits of MBM in reducing incidence of NEC.   Discussed likely length of stay.  Thank you for allowing [place] to participate in her care.  Please call with questions.  Higinio Roger, DO  Neonatologist  The total length of face-to-face or floor / unit time for this encounter was 25 minutes.  Counseling and / or coordination of care was greater than fifty percent of the time.

## 2021-02-12 NOTE — Consult Note (Addendum)
MFM Consult Note  Renee Mcintyre is a 25 year old gravida 1 para 0 currently at 27 weeks and 6 days.  She was admitted last evening due to severe preeclampsia.  The patient reports that she has experienced a headache for the past 2 or 3 days and also is seeing a spot in front of her left eye.  She was seen in the office yesterday morning where she was found to have blood pressures of 172/100 and 192/120.  She denies any history of hypertension.  On admission, the patient required IV labetalol and hydralazine for blood pressure control.  She was then started on labetalol 200 mg twice a day for blood pressure control.  Her blood pressures overnight were within normal limits and her most recent blood pressure was 138/75.  She was also started on magnesium sulfate for maternal seizure prophylaxis and given a course of antenatal corticosteroids.    Her PIH labs on admission showed an increased serum creatinine level of 0.95 and hemoconcentration.  Her liver function tests and platelets were within normal limits.  A P/C ratio on admission was 10.07 indicating significant proteinuria.  The patient reports that her headache has now resolved, although she still is seeing a spot in front of her left eye.  An ultrasound performed this morning shows an EFW of 2 pounds 15 ounces (82nd percentile for her gestational age).  There was normal amniotic fluid noted along with a normal-appearing anterior placenta.  Right pyelectasis measuring 0.9 cm dilated was noted.  The left fetal kidney appeared within normal limits.  The patient was advised to have the baby's kidneys evaluated after birth.    The patient denies any other problems in her current pregnancy.  She had a cell free DNA test drawn earlier in her pregnancy that indicated a low risk for trisomy 22, 48, and 13.  A female fetus is predicted.  She had a normal fetal anatomy scan performed in your office.  The implications and management of preeclampsia was  discussed with the patient and her husband.  She was advised that preeclampsia can affect both the mother and the fetus.  In the mother, preeclampsia may cause a rise in blood pressures and it can affect the mother's kidney, liver, and platelet functions.  It may also cause the mother to have seizures.  In the fetus, it may cause growth restriction and oligohydramnios.  She understands that delivery is the only treatment for preeclampsia.  Due to preeclampsia at her current gestational age, I would recommend that the patient receive a complete course of antenatal corticosteroids.  The patient was advised that due to her headaches, I would recommend that she remain on magnesium sulfate until she completes her steroid course.  Labetalol may be continued in an attempt to control her blood pressures so that she can reach a more optimal gestational age for delivery. The patient understands that usually expectant management of severe preeclampsia is successful in delaying delivery for 5 to 7 days.  The patient was advised that she will be evaluated after she completes her steroid course to determine if delivery is necessary.  I would recommend delivery should her headaches persist at that time, should her blood pressures remain persistently greater than 160/100 despite treatment with medication, should she require additional IV push antihypertensive medications for blood pressure control, should she show any abnormalities in her preeclampsia labs, or should there be nonreassuring fetal status.  Due to severe preeclampsia, should her blood pressures remain normal and her  headaches resolve and her labs remain within normal limits, I would recommend inpatient management and observation with twice weekly preeclampsia labs and daily fetal testing.  Due to severe preeclampsia, she should be delivered at around 34 weeks if everything remains stable.  The patient understands that her baby will require a NICU admission after  delivery.  Should she require delivery at less than 32 weeks, she should receive magnesium sulfate for fetal neuroprotection.  She should also receive magnesium sulfate for maternal seizure prophylaxis for 24 hours after delivery.   A rescue course of antenatal corticosteroids should be given should she require delivery prior to 34 weeks and it has been 7 days since she received her initial course.  At the end of the consultation, the patient and her husband stated that all of their questions have been answered to their complete satisfaction.  Thank you for referring this patient for a Maternal-Fetal Medicine consultation.    Recommendations: Inpatient management until delivery Continue magnesium sulfate until steroid course is completed Continue labetalol for blood pressure control Daily fetal testing  Twice weekly preeclampsia labs while hospitalized Weekly biophysical profile/fluid checks Rescue course of corticosteroids as indicated Delivery at 34 weeks Delivery prior to 34 weeks would be indicated:   Should her blood pressures be persistently greater than 160/100  Should she require IV push medications for blood pressure control  Should she complain of any signs and symptoms of severe preeclampsia  Should her preeclampsia labs show any abnormalities  At any time for nonreassuring fetal status Magnesium sulfate should be given for maternal seizure prophylaxis at the time of delivery

## 2021-02-13 LAB — TYPE AND SCREEN
ABO/RH(D): O NEG
Antibody Screen: NEGATIVE

## 2021-02-13 MED ORDER — LABETALOL HCL 200 MG PO TABS
300.0000 mg | ORAL_TABLET | Freq: Two times a day (BID) | ORAL | Status: DC
  Administered 2021-02-13 (×2): 300 mg via ORAL
  Filled 2021-02-13 (×2): qty 1

## 2021-02-13 MED ORDER — RHO D IMMUNE GLOBULIN 1500 UNIT/2ML IJ SOSY
300.0000 ug | PREFILLED_SYRINGE | Freq: Once | INTRAMUSCULAR | Status: AC
  Administered 2021-02-13: 300 ug via INTRAVENOUS
  Filled 2021-02-13: qty 2

## 2021-02-13 MED ORDER — LABETALOL HCL 200 MG PO TABS
200.0000 mg | ORAL_TABLET | Freq: Once | ORAL | Status: AC
  Administered 2021-02-13: 200 mg via ORAL
  Filled 2021-02-13: qty 1

## 2021-02-13 MED ORDER — TETANUS-DIPHTH-ACELL PERTUSSIS 5-2.5-18.5 LF-MCG/0.5 IM SUSY
0.5000 mL | PREFILLED_SYRINGE | Freq: Once | INTRAMUSCULAR | Status: AC
  Administered 2021-02-13: 0.5 mL via INTRAMUSCULAR
  Filled 2021-02-13: qty 0.5

## 2021-02-13 NOTE — Progress Notes (Signed)
Renee Mcintyre 25 y.o. G1P0 at [redacted]w[redacted]d HD#3 admitted with new onset preeclampsia with severe features  S: Pt doing well this morning no new complaints. States she had a HA this morning but immediately resolved with Tylenol, none currently. No new vision changes. Denies CP or SOB. Denies RUQ or epigastric pain Tolerating regular diet, denies any N/V. Reports good FM. No CTXs, VB, or LOF.  O: Vitals:   02/13/21 1000 02/13/21 1100 02/13/21 1155 02/13/21 1300  BP: 137/81  133/81   Pulse: 84  91   Resp:  17 18 17   Temp:   97.8 F (36.6 C)   TempSrc:   Oral   SpO2:   98%   Weight:      Height:        Intake/Output Summary (Last 24 hours) at 02/13/2021 1338 Last data filed at 02/13/2021 1338 Gross per 24 hour  Intake 7136.05 ml  Output 7200 ml  Net -63.95 ml      Physical Exam General: AAO, NAD Heart: RRR, no audible murmurs Lungs: CTABL no rales no crackles no wheezes Abdomen: gravid uterus, non-tender to palpation Ext: trace b/l LE edema   Fetal Monitoring EFM: cat I baseline 120 bpm mod var +10x10 accels, no decels Toco: acontractile   Cephalic on 04/16/2021 7/2  A/p: 9/2 25 y.o. G1P0 at [redacted]w[redacted]d HD#3 admitted with preeclampsia with severe features, now clinically stable with appropriate BP control  Pre-Eclampsia with severe range blood pressure -BP control: s/p multiple runs of IV labetalol and hydralazine protocol on admission. No additional IV pushes since then. Did have some increase in BP overnight to 140-150/90s with 1 non-sustained severe range. AM PO Labetalol increased to 300 mg. BP's most recently in the 130s/70-80s, will continue on 300 mg BID and titrate as needed -PIH labs WNL with exception of PCR 10 on admission. Will check q 72hr with type and screen -Magnesium seizure ppx initiated with 4 g bolus followed by 2 g/hr maintenance. Continue until BMZ complete at the 24hr s/p 2nd dose mark (~1430 today) -S/p BMZ 7/1-7/2 -S/p MFM growth [birth year]1980 7/2 cephalic presentation  with AGA fetus EFW 2#15 (82%). Appreciate MFM recs per consult note -Will keep on continuous EFM/Toco while on Magnesium- reassuring for GA- then transition to q shift -Modified BPP with fluid check weekly   Antepartum care -Regular diet -SCD VTE ppx for now, consider transition to Lovenox if pt remains stable and prolonged admission likely -PNV and bowel regimen daily -Will need 3rd trimester GDM screening once BP's stable and 3-4 days out from BMZ to avoid false positive -RH NEG, s/p NEG Ab screen today, ppx RhoGam ordered -Routine antepartum care   Lavante Toso A Cereniti Curb 02/13/21 1:36 PM

## 2021-02-13 NOTE — Progress Notes (Signed)
Patient had severe range pressure at 1557, at 1613, 15 minute recheck, RN at bedside still in severe range. RN called OB at 1615 and notified of 2 severe range pressures. Orders given to start Labetalol protocol 20mg  IV and to give 200mg  PO Labetalol once. RN administered orders. At recheck of BP, within normal range. OB notified and ordered NST. NST completed. Continue to monitor.

## 2021-02-13 NOTE — Progress Notes (Signed)
Tdap VIS given and patient educated. Pt to let RN know when/how would like to proceed with Tdap vaccine.

## 2021-02-13 NOTE — Progress Notes (Signed)
TDAP ordered.

## 2021-02-14 LAB — RH IG WORKUP (INCLUDES ABO/RH)
Gestational Age(Wks): 28
Unit division: 0

## 2021-02-14 MED ORDER — HYDRALAZINE HCL 20 MG/ML IJ SOLN: 10.0000 mg | Freq: Once | INTRAMUSCULAR | Status: AC

## 2021-02-14 MED ORDER — LABETALOL HCL 200 MG PO TABS
400.0000 mg | ORAL_TABLET | Freq: Three times a day (TID) | ORAL | Status: DC
  Administered 2021-02-14: 400 mg via ORAL
  Filled 2021-02-14: qty 2

## 2021-02-14 MED ORDER — LABETALOL HCL 200 MG PO TABS
300.0000 mg | ORAL_TABLET | Freq: Three times a day (TID) | ORAL | Status: DC
  Administered 2021-02-14 (×2): 300 mg via ORAL
  Filled 2021-02-14 (×2): qty 1

## 2021-02-14 NOTE — Progress Notes (Signed)
Renee Mcintyre 25 y.o. G1P0  [redacted]w[redacted]d  Preeclampsia with severe features HD# 4  S: Pt doing well this morning no new complaints. Visual black spot remains. HAs resolved, slept well, reports LE swelling decreased a lot. No RUQ pain/ CP/ SOB.   +FMs, no UCs/ LOF/ VB   O: Vitals:   02/13/21 2327 02/14/21 0317 02/14/21 0814 02/14/21 1237  BP: (!) 149/89 (!) 150/86 (!) 158/85 (!) 153/82  Pulse: 79 77 79 74  Resp: 16 15 16 18   Temp: 98.1 F (36.7 C) 98.8 F (37.1 C) 98.1 F (36.7 C) 97.8 F (36.6 C)  TempSrc: Oral Oral Oral Oral  SpO2: 99% 97% 98% 97%  Weight:      Height:        Intake/Output Summary (Last 24 hours) at 02/14/2021 1353 Last data filed at 02/14/2021 0319 Gross per 24 hour  Intake 2780 ml  Output 850 ml  Net 1930 ml       Physical Exam General: AAO, NAD Heart: RRR, no audible murmurs Lungs: CTABL no rales no crackles no wheezes Abdomen: gravid uterus, non-tender to palpation Ext: trace b/l LE edema (feet only) Homman's neg   Fetal Monitoring EFM: cat I baseline 120 bpm mod var +10x10 accels, no decels- one late decels noted earlier, so continued extended monitoring, reviewed lateral position for sleeping and has resolved. To back to 1 hr monitoring every shift  Toco: acontractile   Cephalic on 04/17/2021 7/2  A/p 9/2 25 y.o. G1P0 at 28w 1d HD# 4 admitted with preeclampsia with severe features, clinically stable with intermittently elevated Bps.  Pre-Eclampsia with severe range blood pressure -BP control: now in 150s/ 80s. Prefer not to drop BP too much to support placenta flow. Changed to Labetalol 300mg  q8 hrs.  -PIH labs WNL with exception of PCR 10 on admission. Continue labs q 72hr with type and screen S/p -Magnesium sulphate 48 hrs.  -S/p BMZ 7/1-7/2 -S/p MFM growth 7/2 cephalic presentation with AGA fetus EFW 2#15 (82%).  - consider worsening BPs and occ late decels, plan BPP/ UAD twice/ wkly - Tues/ Fri    Antepartum care -Regular diet -SCD  VTE prophylaxis. Lovenox not started as she may need immediate delivery if worsens.  -PNV and bowel regimen daily -Will need 3rd trimester GDM screening once BP's stable and 3-4 days out from BMZ to avoid false positive -RH NEG,ab screen neg. S/p RhoGam -S/p TDAP -Routine antepartum care   [place] 02/14/21 1:53 PM

## 2021-02-14 NOTE — Progress Notes (Signed)
Blood Pressure Review Note  Received calls from Langston Masker, [place] regarding patient Bps this evening. She had persistent severe ranges starting at 2000. The IV Labetalol protocol was initiated. She received 20, 40 80 of labetalol with continued severe ranges. She then received IV hydralazine 10 and 10 with improvement to non-severe range. Patient remained asymptomatic throughout this time. FHR tracing although discontinuous at times remained reassuring. Patient received last dose of PO labetalol 300 mg at 1500. PO Labetalol increased to 400 mg q 8 hours and will give first dose now. Continue close BP monitoring. Repeat PIH labs in AM given need for additional antihypertensives, and repeat q72hr or sooner as indicated. BPP and Doppler studies ordered for AM. Continue with extended fetal monitoring q shift for now.  Renee Mcintyre 02/14/21 10:44 PM

## 2021-02-14 NOTE — Progress Notes (Signed)
Fetal Evaluation Note  Called by [place], RN to evaluate FHR tracing. Patient having prolonged monitoring q shift. I have reviewed the tracing and found initial tracing reassuring for gestational age with moderate variability. Approaching the 1hr mark, spontaneous variable deceleration down to the 80's lasting 1 minute and self recovered. Maternal repositioning performed, followed by 2 smaller spontaneous variable decelerations, again with return to baseline without interventions and moderate variability in throughout. Will extend monitoring, if no additional decelerations for at least 20 minutes from last variable ok to discontinue afternoon monitoring. Patient will have extended fetal monitoring again this evening.  Renee Mcintyre A Denvil Canning 02/14/21 5:35 PM

## 2021-02-14 NOTE — Progress Notes (Signed)
CSW met with patient and FOB at bedside. CSW introduced self and explained role. CSW answered questions and encouraged patient to follow up with insurance company with further questions. CSW provided patient with CSW contact information if any further needs/concerns arise.   Abundio Miu, [place] Worker [facility] Cell#: [phone]

## 2021-02-15 ENCOUNTER — Inpatient Hospital Stay (HOSPITAL_BASED_OUTPATIENT_CLINIC_OR_DEPARTMENT_OTHER): Payer: 59

## 2021-02-15 DIAGNOSIS — Z3A28 28 weeks gestation of pregnancy: Secondary | ICD-10-CM

## 2021-02-15 DIAGNOSIS — O99213 Obesity complicating pregnancy, third trimester: Secondary | ICD-10-CM

## 2021-02-15 DIAGNOSIS — O1413 Severe pre-eclampsia, third trimester: Secondary | ICD-10-CM

## 2021-02-15 LAB — CBC WITH DIFFERENTIAL/PLATELET
Abs Immature Granulocytes: 0.07 10*3/uL (ref 0.00–0.07)
Basophils Absolute: 0 10*3/uL (ref 0.0–0.1)
Basophils Relative: 0 %
Eosinophils Absolute: 0.1 10*3/uL (ref 0.0–0.5)
Eosinophils Relative: 1 %
HCT: 35.7 % — ABNORMAL LOW (ref 36.0–46.0)
Hemoglobin: 11.8 g/dL — ABNORMAL LOW (ref 12.0–15.0)
Immature Granulocytes: 1 %
Lymphocytes Relative: 26 %
Lymphs Abs: 2.9 10*3/uL (ref 0.7–4.0)
MCH: 31 pg (ref 26.0–34.0)
MCHC: 33.1 g/dL (ref 30.0–36.0)
MCV: 93.7 fL (ref 80.0–100.0)
Monocytes Absolute: 0.8 10*3/uL (ref 0.1–1.0)
Monocytes Relative: 7 %
Neutro Abs: 7.3 10*3/uL (ref 1.7–7.7)
Neutrophils Relative %: 65 %
Platelets: 205 10*3/uL (ref 150–400)
RBC: 3.81 MIL/uL — ABNORMAL LOW (ref 3.87–5.11)
RDW: 15.3 % (ref 11.5–15.5)
WBC: 11.2 10*3/uL — ABNORMAL HIGH (ref 4.0–10.5)
nRBC: 0 % (ref 0.0–0.2)

## 2021-02-15 LAB — COMPREHENSIVE METABOLIC PANEL
ALT: 22 U/L (ref 0–44)
AST: 23 U/L (ref 15–41)
Albumin: 2.4 g/dL — ABNORMAL LOW (ref 3.5–5.0)
Alkaline Phosphatase: 66 U/L (ref 38–126)
Anion gap: 8 (ref 5–15)
BUN: 16 mg/dL (ref 6–20)
CO2: 22 mmol/L (ref 22–32)
Calcium: 8.7 mg/dL — ABNORMAL LOW (ref 8.9–10.3)
Chloride: 105 mmol/L (ref 98–111)
Creatinine, Ser: 0.9 mg/dL (ref 0.44–1.00)
GFR, Estimated: >60 mL/min (ref >60)
Glucose, Bld: 86 mg/dL (ref 70–99)
Potassium: 4.3 mmol/L (ref 3.5–5.1)
Sodium: 135 mmol/L (ref 135–145)
Total Bilirubin: 0.6 mg/dL (ref 0.3–1.2)
Total Protein: 5.2 g/dL — ABNORMAL LOW (ref 6.5–8.1)

## 2021-02-15 MED ORDER — LABETALOL HCL 200 MG PO TABS
600.0000 mg | ORAL_TABLET | Freq: Once | ORAL | Status: AC
  Administered 2021-02-15: 600 mg via ORAL
  Filled 2021-02-15: qty 3

## 2021-02-15 MED ORDER — LABETALOL HCL 200 MG PO TABS
600.0000 mg | ORAL_TABLET | Freq: Three times a day (TID) | ORAL | Status: DC
  Administered 2021-02-15 – 2021-02-18 (×9): 600 mg via ORAL
  Filled 2021-02-15 (×9): qty 3

## 2021-02-15 NOTE — Progress Notes (Signed)
Renee Mcintyre 25 y.o. G1P0 at [redacted]w[redacted]d HD#5 admitted with severe preeclampsia  S: Pt is doing well this morning with no complaints. She reports mild HA last night when BP was elevated but resolved with Tylenol and has not returned. The central vision loss in her left eye is unchanged, and denies any visual disturbances in her right eye. Denies CP, SOB, epigastric/RUQ pain, N/V. She reports good FM. Denies any CTXs, VB, or LOF.  O: Vitals:   02/15/21 0452 02/15/21 0504 02/15/21 0743 02/15/21 0803  BP: (!) 164/95 (!) 154/98 (!) 160/89 (!) 155/92  Pulse: 76 81 85 75  Resp:    18  Temp:    98 F (36.7 C)  TempSrc:    Oral  SpO2:    99%  Weight:      Height:       CBC    Component Value Date/Time   WBC 11.2 (H) 02/15/2021 0539   RBC 3.81 (L) 02/15/2021 0539   HGB 11.8 (L) 02/15/2021 0539   HCT 35.7 (L) 02/15/2021 0539   PLT 205 02/15/2021 0539   MCV 93.7 02/15/2021 0539   MCV 90.8 03/03/2016 1906   MCH 31.0 02/15/2021 0539   MCHC 33.1 02/15/2021 0539   RDW 15.3 02/15/2021 0539   LYMPHSABS 2.9 02/15/2021 0539   MONOABS 0.8 02/15/2021 0539   EOSABS 0.1 02/15/2021 0539   BASOSABS 0.0 02/15/2021 0539   CMP     Component Value Date/Time   NA 135 02/15/2021 0539   NA 139 09/11/2018 0000   K 4.3 02/15/2021 0539   CL 105 02/15/2021 0539   CO2 22 02/15/2021 0539   GLUCOSE 86 02/15/2021 0539   BUN 16 02/15/2021 0539   BUN 9 09/11/2018 0000   CREATININE 0.90 02/15/2021 0539   CREATININE 0.90 03/03/2016 1858   CALCIUM 8.7 (L) 02/15/2021 0539   PROT 5.2 (L) 02/15/2021 0539   ALBUMIN 2.4 (L) 02/15/2021 0539   AST 23 02/15/2021 0539   ALT 22 02/15/2021 0539   ALKPHOS 66 02/15/2021 0539   BILITOT 0.6 02/15/2021 0539   GFRNONAA >60 02/15/2021 0539   GFRNONAA >89 03/03/2016 1858   GFRAA >89 03/03/2016 1858    Physical Exam General: AAO, NAD Heart: RRR, no audible murmurs Lungs: CTABL no rales no crackles no wheezes Abdomen: gravid uterus, non-tender to palpation Ext: trace  b/l LE edema   Fetal Monitoring EFM: cat I baseline 135 bpm mod var +10x10 accels, no decels Toco: acontractile   Cephalic on [place] 7/2  A/P: Renee Mcintyre 25 y.o. G1P0 at [redacted]w[redacted]d HD#5 admitted with acute onset preeclampsia with severe features, now clinically stable but BP difficult to control  Pre-Eclampsia with severe range blood pressure -BP control: s/p multiple runs of IV labetalol and hydralazine protocol on admission and again overnight. PO Labetalol increased to 600 mg q 8hr with first dose given at 0430 AM today. D/W pt BP's becoming more difficult to control, and approaching max dosing of both PO and IV labetalol for 24hr periods. If unable to control BP on max antihypertensive agents, delivery will be likely -Counseled patient and husband at bedside on likely need for cesarean delivery given acuity of Bps. We discussed cesarean expectations and postop recovery and answered questions to their satisfaction -PIH labs WNL with exception of PCR 10 on admission. Repeated this AM- WNL -S/p Mag 7/1-7/3. Discussed need for Mag at time of delivery for 24hr PP -S/p BMZ 7/1-7/2 -S/p MFM growth [place] 7/2 cephalic presentation with  AGA fetus EFW 2#15 (82%). BPP and UA Doppler sono this morning- report pending -Prolonged fetal monitoring: 1hr q shift, had some variables yesterday, none this morning and reassuring for GA  -s/p NICU consult 7/2   Antepartum care -Regular diet for now, transition to NPO if BP's worsening and delivery imminent -SCD VTE ppx  -PNV and bowel regimen daily -Consider GDM screening later this week if still pregnant given recent BMZ admin -RH NEG, s/p Rhogam 7/3 -S/p Tdap 7/4 -Routine antepartum care   Jake Fuhrmann A Alaysha Jefcoat 02/15/21 10:33 AM

## 2021-02-16 LAB — COMPREHENSIVE METABOLIC PANEL
ALT: 20 U/L (ref 0–44)
AST: 28 U/L (ref 15–41)
Albumin: 2.3 g/dL — ABNORMAL LOW (ref 3.5–5.0)
Alkaline Phosphatase: 65 U/L (ref 38–126)
Anion gap: 7 (ref 5–15)
BUN: 13 mg/dL (ref 6–20)
CO2: 21 mmol/L — ABNORMAL LOW (ref 22–32)
Calcium: 8.6 mg/dL — ABNORMAL LOW (ref 8.9–10.3)
Chloride: 106 mmol/L (ref 98–111)
Creatinine, Ser: 1.01 mg/dL — ABNORMAL HIGH (ref 0.44–1.00)
GFR, Estimated: >60 mL/min (ref >60)
Glucose, Bld: 92 mg/dL (ref 70–99)
Potassium: 4.5 mmol/L (ref 3.5–5.1)
Sodium: 134 mmol/L — ABNORMAL LOW (ref 135–145)
Total Bilirubin: 0.4 mg/dL (ref 0.3–1.2)
Total Protein: 5.1 g/dL — ABNORMAL LOW (ref 6.5–8.1)

## 2021-02-16 LAB — CBC WITH DIFFERENTIAL/PLATELET
Abs Immature Granulocytes: 0.04 10*3/uL (ref 0.00–0.07)
Basophils Absolute: 0 10*3/uL (ref 0.0–0.1)
Basophils Relative: 0 %
Eosinophils Absolute: 0.1 10*3/uL (ref 0.0–0.5)
Eosinophils Relative: 1 %
HCT: 35.7 % — ABNORMAL LOW (ref 36.0–46.0)
Hemoglobin: 11.9 g/dL — ABNORMAL LOW (ref 12.0–15.0)
Immature Granulocytes: 0 %
Lymphocytes Relative: 24 %
Lymphs Abs: 2.7 10*3/uL (ref 0.7–4.0)
MCH: 30.7 pg (ref 26.0–34.0)
MCHC: 33.3 g/dL (ref 30.0–36.0)
MCV: 92 fL (ref 80.0–100.0)
Monocytes Absolute: 0.8 10*3/uL (ref 0.1–1.0)
Monocytes Relative: 7 %
Neutro Abs: 7.7 10*3/uL (ref 1.7–7.7)
Neutrophils Relative %: 68 %
Platelets: 201 10*3/uL (ref 150–400)
RBC: 3.88 MIL/uL (ref 3.87–5.11)
RDW: 14.8 % (ref 11.5–15.5)
WBC: 11.4 10*3/uL — ABNORMAL HIGH (ref 4.0–10.5)
nRBC: 0 % (ref 0.0–0.2)

## 2021-02-16 LAB — TYPE AND SCREEN
ABO/RH(D): O NEG
Antibody Screen: POSITIVE

## 2021-02-16 MED ORDER — NIFEDIPINE ER OSMOTIC RELEASE 30 MG PO TB24
30.0000 mg | ORAL_TABLET | Freq: Every day | ORAL | Status: DC
  Administered 2021-02-16 – 2021-02-17 (×2): 30 mg via ORAL
  Filled 2021-02-16 (×2): qty 1

## 2021-02-16 NOTE — Progress Notes (Signed)
No new c/o; no vision changes, left eye vision loss stable Mild h/a just started but not new and goes away with tylenol if need    Patient Vitals for the past 24 hrs:  BP Temp Temp src Pulse Resp SpO2  02/16/21 1139 (!) 154/90 98 F (36.7 C) Oral 75 18 100 %  02/16/21 0731 (!) 158/100 97.9 F (36.6 C) Oral 84 18 100 %  02/16/21 0344 (!) 155/91 98.5 F (36.9 C) Oral 79 18 97 %  02/15/21 2345 (!) 157/90 98 F (36.7 C) Oral 74 18 100 %  02/15/21 2335 (!) 160/93 -- -- 81 -- --  02/15/21 1912 (!) 159/99 97.7 F (36.5 C) Oral 85 18 97 %  02/15/21 1633 (!) 156/87 -- -- 78 -- --  02/15/21 1613 (!) 160/94 98 F (36.7 C) Oral 81 18 98 %   A/P:  28w 4d IUP   Pre-Eclampsia with severe features (BP) remote from term -Labetalol 600 q 8, now with Procardia XL 30mg   -Counseled patient and husband at bedside re bp meds and will closely follow, if uncontrolled then would need to proceed with delivery and they are aware of this; inform if sx change   -PIH labs, last done midnight last night, increasing creatinine - repeat 7/8 am -S/p BMZ 7/1-7/2 complete -S/p MFM growth 04-25-1980 7/2 cephalic presentation with AGA fetus EFW 2#15 (82%). BPP 8/8 yesterday, plan q 3 days -s/p NICU consult 7/2   -Regular diet for now, plan NPO if BP's worsening/worsening labs and delivery imminent; would also proceed with delivery if fetal status non-reassuring

## 2021-02-16 NOTE — Progress Notes (Signed)
Renee Mcintyre 25 y.o. G1P0 at [redacted]w[redacted]d HD#7 admitted with severe preeclampsia  S: Pt is doing well this morning with no complaints. She reports no further HAs last night or today.The central vision loss in her left eye is unchanged, and denies any visual disturbances in her right eye. Denies CP, SOB, epigastric/RUQ pain, N/V. She reports good FM. Denies any CTXs, VB, or LOF.  O: Vitals:   02/15/21 2335 02/15/21 2345 02/16/21 0344 02/16/21 0731  BP: (!) 160/93 (!) 157/90 (!) 155/91 (!) 158/100  Pulse: 81 74 79 84  Resp:  18 18 18   Temp:  98 F (36.7 C) 98.5 F (36.9 C) 97.9 F (36.6 C)  TempSrc:  Oral Oral Oral  SpO2:  100% 97% 100%  Weight:      Height:       CBC    Component Value Date/Time   WBC 11.4 (H) 02/16/2021 0040   RBC 3.88 02/16/2021 0040   HGB 11.9 (L) 02/16/2021 0040   HCT 35.7 (L) 02/16/2021 0040   PLT 201 02/16/2021 0040   MCV 92.0 02/16/2021 0040   MCV 90.8 03/03/2016 1906   MCH 30.7 02/16/2021 0040   MCHC 33.3 02/16/2021 0040   RDW 14.8 02/16/2021 0040   LYMPHSABS 2.7 02/16/2021 0040   MONOABS 0.8 02/16/2021 0040   EOSABS 0.1 02/16/2021 0040   BASOSABS 0.0 02/16/2021 0040   CMP     Component Value Date/Time   NA 134 (L) 02/16/2021 0040   NA 139 09/11/2018 0000   K 4.5 02/16/2021 0040   CL 106 02/16/2021 0040   CO2 21 (L) 02/16/2021 0040   GLUCOSE 92 02/16/2021 0040   BUN 13 02/16/2021 0040   BUN 9 09/11/2018 0000   CREATININE 1.01 (H) 02/16/2021 0040   CREATININE 0.90 03/03/2016 1858   CALCIUM 8.6 (L) 02/16/2021 0040   PROT 5.1 (L) 02/16/2021 0040   ALBUMIN 2.3 (L) 02/16/2021 0040   AST 28 02/16/2021 0040   ALT 20 02/16/2021 0040   ALKPHOS 65 02/16/2021 0040   BILITOT 0.4 02/16/2021 0040   GFRNONAA >60 02/16/2021 0040   GFRNONAA >89 03/03/2016 1858   GFRAA >89 03/03/2016 1858    Physical Exam General: AAO, NAD Heart: RRR, no audible murmurs Lungs: CTABL no rales no crackles no wheezes Abdomen: gravid uterus, non-tender to  palpation Ext: trace b/l LE edema, DTRs 2+ Neuro: non focal Skin: intact   Fetal Monitoring EFM: cat I baseline 145 bpm mod var +10x10 accels, no decels Toco: acontractile   Cephalic on 03/05/2016 7/5, BPP 8/8, no dopplers done AGA with nl AFI sono 7/2  A/P: 28w 4d IUP  Pre-Eclampsia with severe features (BP) remote from term -Labetalol 600 q 8 with BPs staying at 160/100 . Will add Procardia XL 30mg  this am. No new onset CNS disturbance Liver enzymes and platelets remain nl. Fetal status reassuring.  -Counseled patient and husband at bedside on possible need for cesarean delivery given acuity of Bps  -PIH labs WNL with creatinine now 1.01 -S/p Mag 7/1-7/3. Discussed need for Mag at time of delivery for 24hr PP -S/p BMZ 7/1-7/2 complete -S/p MFM growth 10[birth year]1987 7/2 cephalic presentation with AGA fetus EFW 2#15 (82%). BPP 8/8 yesterday -s/p NICU consult 7/2   Antepartum care -Regular diet for now, transition to NPO if BP's worsening and delivery imminent -SCD VTE ppx  -PNV and bowel regimen daily -Consider GDM screening later this week if still pregnant given recent BMZ admin -RH NEG, s/p Rhogam 7/3 -  S/p Tdap 7/4   Tilley Faeth J 02/16/21 9:26 AM

## 2021-02-17 ENCOUNTER — Inpatient Hospital Stay (HOSPITAL_COMMUNITY): Payer: 59 | Admitting: Anesthesiology

## 2021-02-17 ENCOUNTER — Encounter (HOSPITAL_COMMUNITY): Payer: Self-pay | Admitting: Obstetrics and Gynecology

## 2021-02-17 ENCOUNTER — Encounter (HOSPITAL_COMMUNITY)
Admission: AD | Disposition: A | Discharge status: Home / Self Care | Payer: Self-pay | Source: Home / Self Care | Attending: Obstetrics and Gynecology

## 2021-02-17 DIAGNOSIS — H5462 Unqualified visual loss, left eye, normal vision right eye: Secondary | ICD-10-CM | POA: Diagnosis present

## 2021-02-17 DIAGNOSIS — O1414 Severe pre-eclampsia complicating childbirth: Secondary | ICD-10-CM | POA: Diagnosis present

## 2021-02-17 DIAGNOSIS — O1413 Severe pre-eclampsia, third trimester: Secondary | ICD-10-CM | POA: Diagnosis not present

## 2021-02-17 DIAGNOSIS — Z6791 Unspecified blood type, Rh negative: Secondary | ICD-10-CM | POA: Diagnosis not present

## 2021-02-17 DIAGNOSIS — R000 Tachycardia, unspecified: Secondary | ICD-10-CM | POA: Diagnosis present

## 2021-02-17 DIAGNOSIS — Z3A28 28 weeks gestation of pregnancy: Secondary | ICD-10-CM | POA: Diagnosis not present

## 2021-02-17 DIAGNOSIS — Z3A27 27 weeks gestation of pregnancy: Secondary | ICD-10-CM | POA: Diagnosis not present

## 2021-02-17 DIAGNOSIS — O99214 Obesity complicating childbirth: Secondary | ICD-10-CM | POA: Diagnosis present

## 2021-02-17 DIAGNOSIS — O26892 Other specified pregnancy related conditions, second trimester: Secondary | ICD-10-CM | POA: Diagnosis present

## 2021-02-17 DIAGNOSIS — O99213 Obesity complicating pregnancy, third trimester: Secondary | ICD-10-CM | POA: Diagnosis not present

## 2021-02-17 DIAGNOSIS — Z98891 History of uterine scar from previous surgery: Secondary | ICD-10-CM

## 2021-02-17 DIAGNOSIS — Z23 Encounter for immunization: Secondary | ICD-10-CM | POA: Diagnosis not present

## 2021-02-17 DIAGNOSIS — H3500 Unspecified background retinopathy: Secondary | ICD-10-CM | POA: Diagnosis present

## 2021-02-17 DIAGNOSIS — O99892 Other specified diseases and conditions complicating childbirth: Secondary | ICD-10-CM | POA: Diagnosis present

## 2021-02-17 LAB — COMPREHENSIVE METABOLIC PANEL
ALT: 23 U/L (ref 0–44)
AST: 32 U/L (ref 15–41)
Albumin: 2.5 g/dL — ABNORMAL LOW (ref 3.5–5.0)
Alkaline Phosphatase: 86 U/L (ref 38–126)
Anion gap: 8 (ref 5–15)
BUN: 11 mg/dL (ref 6–20)
CO2: 23 mmol/L (ref 22–32)
Calcium: 8.7 mg/dL — ABNORMAL LOW (ref 8.9–10.3)
Chloride: 104 mmol/L (ref 98–111)
Creatinine, Ser: 0.95 mg/dL (ref 0.44–1.00)
GFR, Estimated: >60 mL/min (ref >60)
Glucose, Bld: 100 mg/dL — ABNORMAL HIGH (ref 70–99)
Potassium: 4.2 mmol/L (ref 3.5–5.1)
Sodium: 135 mmol/L (ref 135–145)
Total Bilirubin: 0.4 mg/dL (ref 0.3–1.2)
Total Protein: 5.5 g/dL — ABNORMAL LOW (ref 6.5–8.1)

## 2021-02-17 LAB — CBC WITH DIFFERENTIAL/PLATELET
Abs Immature Granulocytes: 0.08 10*3/uL — ABNORMAL HIGH (ref 0.00–0.07)
Basophils Absolute: 0 10*3/uL (ref 0.0–0.1)
Basophils Relative: 0 %
Eosinophils Absolute: 0.2 10*3/uL (ref 0.0–0.5)
Eosinophils Relative: 1 %
HCT: 40 % (ref 36.0–46.0)
Hemoglobin: 13.4 g/dL (ref 12.0–15.0)
Immature Granulocytes: 1 %
Lymphocytes Relative: 22 %
Lymphs Abs: 2.9 10*3/uL (ref 0.7–4.0)
MCH: 30.6 pg (ref 26.0–34.0)
MCHC: 33.5 g/dL (ref 30.0–36.0)
MCV: 91.3 fL (ref 80.0–100.0)
Monocytes Absolute: 0.8 10*3/uL (ref 0.1–1.0)
Monocytes Relative: 6 %
Neutro Abs: 9.1 10*3/uL — ABNORMAL HIGH (ref 1.7–7.7)
Neutrophils Relative %: 70 %
Platelets: 210 10*3/uL (ref 150–400)
RBC: 4.38 MIL/uL (ref 3.87–5.11)
RDW: 14.4 % (ref 11.5–15.5)
WBC: 12.9 10*3/uL — ABNORMAL HIGH (ref 4.0–10.5)
nRBC: 0 % (ref 0.0–0.2)

## 2021-02-17 SURGERY — Surgical Case
Anesthesia: Spinal

## 2021-02-17 MED ORDER — OXYCODONE HCL 5 MG PO TABS: 5.0000 mg | ORAL_TABLET | ORAL | Status: DC | PRN

## 2021-02-17 MED ORDER — OXYCODONE HCL 5 MG PO TABS
5.0000 mg | ORAL_TABLET | ORAL | Status: DC | PRN
Start: 2021-02-17 — End: 2021-02-17
  Administered 2021-02-17 (×2): 5 mg via ORAL
  Filled 2021-02-17: qty 2
  Filled 2021-02-17: qty 1

## 2021-02-17 MED ORDER — FENTANYL CITRATE (PF) 100 MCG/2ML IJ SOLN
INTRAMUSCULAR | Status: DC | PRN
  Administered 2021-02-17: 15 ug via INTRATHECAL

## 2021-02-17 MED ORDER — ZOLPIDEM TARTRATE 5 MG PO TABS: 5.0000 mg | ORAL_TABLET | Freq: Every evening | ORAL | Status: DC | PRN

## 2021-02-17 MED ORDER — COCONUT OIL OIL: 1.0000 "application " | TOPICAL_OIL | Status: DC | PRN

## 2021-02-17 MED ORDER — NALBUPHINE HCL 10 MG/ML IJ SOLN: 5.0000 mg | Freq: Once | INTRAMUSCULAR | Status: DC | PRN

## 2021-02-17 MED ORDER — MAGNESIUM SULFATE 40 GM/1000ML IV SOLN
2.0000 g/h | INTRAVENOUS | Status: AC
  Administered 2021-02-17 – 2021-02-18 (×2): 2 g/h via INTRAVENOUS
  Filled 2021-02-17 (×2): qty 1000

## 2021-02-17 MED ORDER — NALBUPHINE HCL 10 MG/ML IJ SOLN
5.0000 mg | INTRAMUSCULAR | Status: DC | PRN
Start: 2021-02-17 — End: 2021-02-22

## 2021-02-17 MED ORDER — SODIUM CHLORIDE 0.9% FLUSH: 3.0000 mL | INTRAVENOUS | Status: DC | PRN

## 2021-02-17 MED ORDER — PHENYLEPHRINE HCL-NACL 20-0.9 MG/250ML-% IV SOLN: INTRAVENOUS | Status: DC | PRN

## 2021-02-17 MED ORDER — ONDANSETRON HCL 4 MG/2ML IJ SOLN: 4.0000 mg | Freq: Three times a day (TID) | INTRAMUSCULAR | Status: DC | PRN

## 2021-02-17 MED ORDER — LACTATED RINGERS IV SOLN: INTRAVENOUS | Status: DC | PRN

## 2021-02-17 MED ORDER — PHENYLEPHRINE HCL (PRESSORS) 10 MG/ML IV SOLN
INTRAVENOUS | Status: DC | PRN
  Administered 2021-02-17: 40 ug via INTRAVENOUS

## 2021-02-17 MED ORDER — DIPHENHYDRAMINE HCL 50 MG/ML IJ SOLN: 12.5000 mg | INTRAMUSCULAR | Status: DC | PRN

## 2021-02-17 MED ORDER — ONDANSETRON HCL 4 MG/2ML IJ SOLN
INTRAMUSCULAR | Status: AC
  Filled 2021-02-17: qty 2

## 2021-02-17 MED ORDER — PHENYLEPHRINE HCL-NACL 20-0.9 MG/250ML-% IV SOLN
INTRAVENOUS | Status: DC | PRN
  Administered 2021-02-17: 30 ug/min via INTRAVENOUS

## 2021-02-17 MED ORDER — LACTATED RINGERS IV SOLN: INTRAVENOUS | Status: DC

## 2021-02-17 MED ORDER — DIPHENHYDRAMINE HCL 25 MG PO CAPS: 25.0000 mg | ORAL_CAPSULE | Freq: Four times a day (QID) | ORAL | Status: DC | PRN

## 2021-02-17 MED ORDER — HYDROMORPHONE HCL 1 MG/ML IJ SOLN: 0.2500 mg | INTRAMUSCULAR | Status: DC | PRN

## 2021-02-17 MED ORDER — OXYTOCIN-SODIUM CHLORIDE 30-0.9 UT/500ML-% IV SOLN: 2.5000 [IU]/h | INTRAVENOUS | Status: DC

## 2021-02-17 MED ORDER — SIMETHICONE 80 MG PO CHEW: 80.0000 mg | CHEWABLE_TABLET | ORAL | Status: DC | PRN

## 2021-02-17 MED ORDER — SIMETHICONE 80 MG PO CHEW
80.0000 mg | CHEWABLE_TABLET | Freq: Three times a day (TID) | ORAL | Status: DC
  Administered 2021-02-17 – 2021-02-22 (×14): 80 mg via ORAL
  Filled 2021-02-17 (×14): qty 1

## 2021-02-17 MED ORDER — SOD CITRATE-CITRIC ACID 500-334 MG/5ML PO SOLN
ORAL | Status: AC
  Administered 2021-02-17: 30 mL
  Filled 2021-02-17: qty 30

## 2021-02-17 MED ORDER — OXYTOCIN-SODIUM CHLORIDE 30-0.9 UT/500ML-% IV SOLN
INTRAVENOUS | Status: DC | PRN
  Administered 2021-02-17: 300 mL via INTRAVENOUS

## 2021-02-17 MED ORDER — NALOXONE HCL 4 MG/10ML IJ SOLN
1.0000 ug/kg/h | INTRAVENOUS | Status: DC | PRN
  Filled 2021-02-17: qty 5

## 2021-02-17 MED ORDER — FENTANYL CITRATE (PF) 100 MCG/2ML IJ SOLN
INTRAMUSCULAR | Status: AC
  Filled 2021-02-17: qty 2

## 2021-02-17 MED ORDER — SOD CITRATE-CITRIC ACID 500-334 MG/5ML PO SOLN: 30.0000 mL | Freq: Once | ORAL | Status: AC

## 2021-02-17 MED ORDER — DEXTROSE 5 % IV SOLN
INTRAVENOUS | Status: DC | PRN
  Administered 2021-02-17: 3 g via INTRAVENOUS

## 2021-02-17 MED ORDER — ACETAMINOPHEN 10 MG/ML IV SOLN
INTRAVENOUS | Status: AC
  Filled 2021-02-17: qty 100

## 2021-02-17 MED ORDER — MEPERIDINE HCL 25 MG/ML IJ SOLN: 6.2500 mg | INTRAMUSCULAR | Status: DC | PRN

## 2021-02-17 MED ORDER — OXYCODONE HCL 5 MG/5ML PO SOLN: 5.0000 mg | Freq: Once | ORAL | Status: DC | PRN

## 2021-02-17 MED ORDER — ONDANSETRON HCL 4 MG/2ML IJ SOLN
INTRAMUSCULAR | Status: DC | PRN
  Administered 2021-02-17: 4 mg via INTRAVENOUS

## 2021-02-17 MED ORDER — TETANUS-DIPHTH-ACELL PERTUSSIS 5-2.5-18.5 LF-MCG/0.5 IM SUSY: 0.5000 mL | PREFILLED_SYRINGE | Freq: Once | INTRAMUSCULAR | Status: DC

## 2021-02-17 MED ORDER — DEXAMETHASONE SODIUM PHOSPHATE 4 MG/ML IJ SOLN
INTRAMUSCULAR | Status: DC | PRN
  Administered 2021-02-17: 4 mg via INTRAVENOUS

## 2021-02-17 MED ORDER — MENTHOL 3 MG MT LOZG: 1.0000 | LOZENGE | OROMUCOSAL | Status: DC | PRN

## 2021-02-17 MED ORDER — SCOPOLAMINE 1 MG/3DAYS TD PT72
1.0000 | MEDICATED_PATCH | Freq: Once | TRANSDERMAL | Status: DC
  Administered 2021-02-17: 1.5 mg via TRANSDERMAL

## 2021-02-17 MED ORDER — PROMETHAZINE HCL 25 MG/ML IJ SOLN: 6.2500 mg | INTRAMUSCULAR | Status: DC | PRN

## 2021-02-17 MED ORDER — NALOXONE HCL 0.4 MG/ML IJ SOLN: 0.4000 mg | INTRAMUSCULAR | Status: DC | PRN

## 2021-02-17 MED ORDER — FENTANYL CITRATE (PF) 100 MCG/2ML IJ SOLN: INTRAMUSCULAR | Status: DC | PRN

## 2021-02-17 MED ORDER — IBUPROFEN 600 MG PO TABS
600.0000 mg | ORAL_TABLET | Freq: Four times a day (QID) | ORAL | Status: AC
  Administered 2021-02-17 – 2021-02-20 (×12): 600 mg via ORAL
  Filled 2021-02-17 (×12): qty 1

## 2021-02-17 MED ORDER — ACETAMINOPHEN 10 MG/ML IV SOLN
INTRAVENOUS | Status: DC | PRN
  Administered 2021-02-17: 1000 mg via INTRAVENOUS

## 2021-02-17 MED ORDER — KETOROLAC TROMETHAMINE 30 MG/ML IJ SOLN
30.0000 mg | Freq: Once | INTRAMUSCULAR | Status: AC | PRN
  Administered 2021-02-17: 30 mg via INTRAVENOUS

## 2021-02-17 MED ORDER — ACETAMINOPHEN 500 MG PO TABS
1000.0000 mg | ORAL_TABLET | Freq: Four times a day (QID) | ORAL | Status: DC
  Administered 2021-02-17 – 2021-02-22 (×16): 1000 mg via ORAL
  Filled 2021-02-17 (×19): qty 2

## 2021-02-17 MED ORDER — MAGNESIUM SULFATE BOLUS VIA INFUSION
4.0000 g | Freq: Once | INTRAVENOUS | Status: AC
  Administered 2021-02-17: 4 g via INTRAVENOUS
  Filled 2021-02-17: qty 1000

## 2021-02-17 MED ORDER — MORPHINE SULFATE (PF) 0.5 MG/ML IJ SOLN
INTRAMUSCULAR | Status: DC | PRN
  Administered 2021-02-17: 150 ug via INTRATHECAL

## 2021-02-17 MED ORDER — MORPHINE SULFATE (PF) 0.5 MG/ML IJ SOLN
INTRAMUSCULAR | Status: AC
  Filled 2021-02-17: qty 10

## 2021-02-17 MED ORDER — OXYCODONE HCL 5 MG PO TABS: 5.0000 mg | ORAL_TABLET | Freq: Once | ORAL | Status: DC | PRN

## 2021-02-17 MED ORDER — SENNOSIDES-DOCUSATE SODIUM 8.6-50 MG PO TABS
2.0000 | ORAL_TABLET | ORAL | Status: DC
  Administered 2021-02-18 – 2021-02-22 (×5): 2 via ORAL
  Filled 2021-02-17 (×5): qty 2

## 2021-02-17 MED ORDER — CEFAZOLIN IN SODIUM CHLORIDE 3-0.9 GM/100ML-% IV SOLN
INTRAVENOUS | Status: AC
  Filled 2021-02-17: qty 100

## 2021-02-17 MED ORDER — BUPIVACAINE IN DEXTROSE 0.75-8.25 % IT SOLN
INTRATHECAL | Status: DC | PRN
  Administered 2021-02-17: 1.6 mL via INTRATHECAL

## 2021-02-17 MED ORDER — KETOROLAC TROMETHAMINE 30 MG/ML IJ SOLN
INTRAMUSCULAR | Status: AC
  Filled 2021-02-17: qty 1

## 2021-02-17 MED ORDER — NALBUPHINE HCL 10 MG/ML IJ SOLN: 5.0000 mg | INTRAMUSCULAR | Status: DC | PRN

## 2021-02-17 MED ORDER — DIPHENHYDRAMINE HCL 25 MG PO CAPS: 25.0000 mg | ORAL_CAPSULE | ORAL | Status: DC | PRN

## 2021-02-17 MED ORDER — SCOPOLAMINE 1 MG/3DAYS TD PT72
MEDICATED_PATCH | TRANSDERMAL | Status: AC
  Filled 2021-02-17: qty 1

## 2021-02-17 MED ORDER — ENOXAPARIN SODIUM 60 MG/0.6ML IJ SOSY
60.0000 mg | PREFILLED_SYRINGE | INTRAMUSCULAR | Status: DC
Start: 2021-02-18 — End: 2021-02-18

## 2021-02-17 SURGICAL SUPPLY — 36 items
APL SKNCLS STERI-STRIP NONHPOA (GAUZE/BANDAGES/DRESSINGS) ×1
BENZOIN TINCTURE PRP APPL 2/3 (GAUZE/BANDAGES/DRESSINGS) ×1 IMPLANT
CHLORAPREP W/TINT 26ML (MISCELLANEOUS) ×2 IMPLANT
CLAMP CORD UMBIL (MISCELLANEOUS) IMPLANT
CLOSURE STERI STRIP 1/2 X4 (GAUZE/BANDAGES/DRESSINGS) ×1 IMPLANT
CLOTH BEACON ORANGE TIMEOUT ST (SAFETY) ×2 IMPLANT
DRSG OPSITE POSTOP 4X10 (GAUZE/BANDAGES/DRESSINGS) ×2 IMPLANT
ELECT REM PT RETURN 9FT ADLT (ELECTROSURGICAL) ×2
ELECTRODE REM PT RTRN 9FT ADLT (ELECTROSURGICAL) ×1 IMPLANT
EXTRACTOR VACUUM M CUP 4 TUBE (SUCTIONS) IMPLANT
GLOVE BIO SURGEON STRL SZ 6.5 (GLOVE) ×2 IMPLANT
GLOVE BIOGEL PI IND STRL 7.0 (GLOVE) ×2 IMPLANT
GLOVE BIOGEL PI INDICATOR 7.0 (GLOVE) ×2
GOWN STRL REUS W/TWL LRG LVL3 (GOWN DISPOSABLE) ×4 IMPLANT
KIT ABG SYR 3ML LUER SLIP (SYRINGE) IMPLANT
NDL HYPO 25X5/8 SAFETYGLIDE (NEEDLE) IMPLANT
NEEDLE HYPO 22GX1.5 SAFETY (NEEDLE) IMPLANT
NEEDLE HYPO 25X5/8 SAFETYGLIDE (NEEDLE) IMPLANT
NS IRRIG 1000ML POUR BTL (IV SOLUTION) ×2 IMPLANT
PACK C SECTION WH (CUSTOM PROCEDURE TRAY) ×2 IMPLANT
PAD OB MATERNITY 4.3X12.25 (PERSONAL CARE ITEMS) ×2 IMPLANT
PENCIL SMOKE EVAC W/HOLSTER (ELECTROSURGICAL) ×2 IMPLANT
STRIP CLOSURE SKIN 1/2X4 (GAUZE/BANDAGES/DRESSINGS) IMPLANT
SUT MON AB 4-0 PS1 27 (SUTURE) ×2 IMPLANT
SUT PLAIN 0 NONE (SUTURE) IMPLANT
SUT PLAIN 2 0 XLH (SUTURE) ×1 IMPLANT
SUT VIC AB 0 CT1 36 (SUTURE) ×4 IMPLANT
SUT VIC AB 0 CTX 36 (SUTURE) ×4
SUT VIC AB 0 CTX36XBRD ANBCTRL (SUTURE) ×2 IMPLANT
SUT VIC AB 2-0 CT1 27 (SUTURE) ×2
SUT VIC AB 2-0 CT1 TAPERPNT 27 (SUTURE) ×1 IMPLANT
SUT VIC AB 4-0 KS 27 (SUTURE) ×1 IMPLANT
SYR CONTROL 10ML LL (SYRINGE) IMPLANT
TOWEL OR 17X24 6PK STRL BLUE (TOWEL DISPOSABLE) ×2 IMPLANT
TRAY FOLEY W/BAG SLVR 14FR LF (SET/KITS/TRAYS/PACK) IMPLANT
WATER STERILE IRR 1000ML POUR (IV SOLUTION) ×2 IMPLANT

## 2021-02-17 NOTE — Brief Op Note (Signed)
02/11/2021 - 02/17/2021  4:25 PM  PATIENT:  Renee Mcintyre  25 y.o. female  PRE-OPERATIVE DIAGNOSIS:  C/S for severe Pre-E  POST-OPERATIVE DIAGNOSIS:  C/S for severe Pre-E  PROCEDURE:  Procedure(s): CESAREAN SECTION (N/A) Low-transverse cesarean section with 2 layer closure  SURGEON:  Surgeon(s) and Role:    * Noland Fordyce, MD - Primary  PHYSICIAN ASSISTANT:   ASSISTANTS: Dorisann Frames, CNM  ANESTHESIA:   spinal  EBL: Per nursing notes  BLOOD ADMINISTERED:none  DRAINS: Urinary Catheter (Foley)   LOCAL MEDICATIONS USED:  NONE  SPECIMEN: Placenta  DISPOSITION OF SPECIMEN:   To labor and delivery for disposal  COUNTS:  YES  TOURNIQUET:  * No tourniquets in log *  DICTATION: .Note written in EPIC  PLAN OF CARE: Admit to inpatient   PATIENT DISPOSITION:  PACU - hemodynamically stable.   Delay start of Pharmacological VTE agent (>24hrs) due to surgical blood loss or risk of bleeding: yes

## 2021-02-17 NOTE — Transfer of Care (Signed)
Immediate Anesthesia Transfer of Care Note  Patient: Renee Mcintyre  Procedure(s) Performed: CESAREAN SECTION  Patient Location: PACU  Anesthesia Type:Spinal  Level of Consciousness: awake, alert  and oriented  Airway & Oxygen Therapy: Patient Spontanous Breathing  Post-op Assessment: Report given to RN and Post -op Vital signs reviewed and stable  Post vital signs: Reviewed and stable  Last Vitals:  Vitals Value Taken Time  BP    Temp    Pulse 66 02/17/21 1655  Resp 15 02/17/21 1655  SpO2 96 % 02/17/21 1655  Vitals shown include unvalidated device data.  Last Pain:  Vitals:   02/17/21 1152  TempSrc: Oral  PainSc:          Complications: No notable events documented.

## 2021-02-17 NOTE — Anesthesia Preprocedure Evaluation (Addendum)
Anesthesia Evaluation  Patient identified by MRN, date of birth, ID band Patient awake    Reviewed: Allergy & Precautions, NPO status , Patient's Chart, lab work & pertinent test results  Airway Mallampati: II  TM Distance: >3 FB Neck ROM: Full    Dental no notable dental hx.    Pulmonary neg pulmonary ROS,    Pulmonary exam normal breath sounds clear to auscultation       Cardiovascular hypertension, Normal cardiovascular exam Rhythm:Regular Rate:Normal     Neuro/Psych  Headaches, negative psych ROS   GI/Hepatic negative GI ROS, Neg liver ROS,   Endo/Other  negative endocrine ROS  Renal/GU negative Renal ROS  negative genitourinary   Musculoskeletal negative musculoskeletal ROS (+)   Abdominal (+) + obese,   Peds negative pediatric ROS (+)  Hematology negative hematology ROS (+)   Anesthesia Other Findings   Reproductive/Obstetrics (+) Pregnancy                             Anesthesia Physical Anesthesia Plan  ASA: 3 and emergent  Anesthesia Plan: Spinal   Post-op Pain Management:    Induction:   PONV Risk Score and Plan: Treatment may vary due to age or medical condition  Airway Management Planned: Natural Airway  Additional Equipment:   Intra-op Plan:   Post-operative Plan:   Informed Consent: I have reviewed the patients History and Physical, chart, labs and discussed the procedure including the risks, benefits and alternatives for the proposed anesthesia with the patient or authorized representative who has indicated his/her understanding and acceptance.     Dental advisory given  Plan Discussed with: CRNA  Anesthesia Plan Comments:         Anesthesia Quick Evaluation

## 2021-02-17 NOTE — Consult Note (Addendum)
MFM Note  I discussed the management of the patient's pregnancy with Dr. Amado Nash via telephone this morning. She is currently at 28 weeks and 5 days.  The patient continues to complain of a severe headache requiring oxycodone for treatment.    Her blood pressures remain in the 150s to 160s over 90s-100s range despite treatment with Procardia.    Her PIH labs are stable with an elevated serum creatinine level of 0.95.    Due to her significant headache, I recommended that the patient be started on magnesium sulfate for both fetal neuroprotection and maternal seizure prophylaxis.    Should the patient's headache persist later this afternoon, due to severe preeclampsia, I would recommend that she be delivered after the fetus has received a few hours of magnesium.    Should her headache resolve, the magnesium may be given for 12 hours while continued expectant management is pursued.    During my conversation with the patient a few days ago, she understood that usually expectant management of severe preeclampsia is successful in delaying delivery for about 5 to 7 days.

## 2021-02-17 NOTE — Op Note (Signed)
02/11/2021 - 02/17/2021  4:25 PM  PATIENT:  Renee Mcintyre  25 y.o. female  PRE-OPERATIVE DIAGNOSIS:  C/S for severe Pre-E  POST-OPERATIVE DIAGNOSIS:  C/S for severe Pre-E  PROCEDURE:  Procedure(s): CESAREAN SECTION (N/A) Low-transverse cesarean section with 2 layer closure  SURGEON:  Surgeon(s) and Role:    * Noland Fordyce, MD - Primary  PHYSICIAN ASSISTANT:   ASSISTANTS: Dorisann Frames, CNM  ANESTHESIA:   spinal  EBL: Per nursing notes  BLOOD ADMINISTERED:none  DRAINS: Urinary Catheter (Foley)   LOCAL MEDICATIONS USED:  NONE  SPECIMEN: Placenta  DISPOSITION OF SPECIMEN:  To labor and delivery for disposal  COUNTS:  YES  TOURNIQUET:  * No tourniquets in log *  DICTATION: .Note written in EPIC  PLAN OF CARE: Admit to inpatient   PATIENT DISPOSITION:  PACU - hemodynamically stable.   Delay start of Pharmacological VTE agent (>24hrs) due to surgical blood loss or risk of bleeding: yes    Findings:  @BABYSEXEBC @ infant,  APGAR (1 MIN): 6   APGAR (5 MINS): 8   APGAR (10 MINS):   Normal uterus, tubes and ovaries, normal placenta. 3VC, clear amniotic fluid  EBL: Per nursing notes cc Antibiotics:  3g Ancef Complications: none  Indications: This is a 25 y.o. year-old, G1 at [redacted]w[redacted]d admitted for severe preeclampsia by blood pressure criteria and proteinuria.  Patient was given betamethasone and initial magnesium prophylaxis.  Patient was started on labetalol which dose is generally increased over time.  She was then added on Procardia.  Initially had severe range pressures needing IV push medications but then went several days without needing IV push medicines.  Procardia was added yesterday.  Today patient notes headache 9 out of 10 with no new visual symptoms but persistent loss of vision in her central left.  Patient's headache was nonresponsive to magnesium, Tylenol, Fioricet, oxycodone.  MFM consult was obtained which recommended proceeding to delivery and previous  discussion was had as to cesarean section for mode of delivery given patient is remote from term.. Risks benefits and alternatives of the procedure were discussed with the patient who agreed to proceed  Procedure:  After informed consent was obtained the patient was taken to the operating room where spinal anesthesia was initiated.  She was prepped and draped in the normal sterile fashion in dorsal supine position with a leftward tilt.  A foley catheter was in place.  A Pfannenstiel skin incision was made 2 cm above the pubic symphysis in the midline with the scalpel.  Dissection was carried down with the Bovie cautery until the fascia was reached. The fascia was incised in the midline. The incision was extended laterally with the Mayo scissors. The inferior aspect of the fascial incision was grasped with the Coker clamps, elevated up and the underlying rectus muscles were dissected off sharply. The superior aspect of the fascial incision was grasped with the Coker clamps elevated up and the underlying rectus muscles were dissected off sharply.  The peritoneum was entered bluntly. The peritoneal incision was extended superiorly and inferiorly with good visualization of the bladder. The bladder blade was inserted and palpation was done to assess the fetal position and the location of the uterine vessels.  Decision was made to proceed with a low transverse cesarean section.  A bladder flap was created sharply.  The lower segment of the uterus was incised sharply with the scalpel and extended  bluntly in the cephalo-caudal fashion.  Placenta was encountered and brisk bleeding began.  The  infant was grasped, brought to the incision,  rotated and the infant was delivered with fundal pressure.  Pediatrician was assessing baby from off the surgical field and agreed baby was safe for delayed cord clamping.  The nose and mouth were bulb suctioned. The cord was clamped and cut after 1 minute delay. The infant was handed off  to the waiting pediatrician. The placenta was expressed. The uterus was exteriorized. The uterus was cleared of all clots and debris. The uterine incision was repaired with 0 Vicryl in a running locked fashion.  A second layer of the same suture was used in an imbricating fashion to obtain excellent hemostasis.  2 additional figure-of-eight sutures were used in the midpoint of the incision for good hemostasis.  The uterus was then returned to the abdomen, the gutters were cleared of all clots and debris. The uterine incision was reinspected and found to be hemostatic. The peritoneum was grasped and closed with 2-0 Vicryl in a running fashion. The cut muscle edges and the underside of the fascia were inspected and found to be hemostatic. The fascia was closed with 0 Vicryl in 2 halves. The subcutaneous tissue was irrigated. Scarpa's layer was closed with a 2-0 plain gut suture. The skin was closed with a 4-0 Monocryl in a single layer. The patient tolerated the procedure well. Sponge lap and needle counts were correct x3 and patient was taken to the recovery room in a stable condition.  Lendon Colonel 02/17/2021 4:27 PM

## 2021-02-17 NOTE — Anesthesia Postprocedure Evaluation (Signed)
Anesthesia Post Note  Patient: KALLY CADDEN  Procedure(s) Performed: CESAREAN SECTION     Patient location during evaluation: PACU Anesthesia Type: Spinal Level of consciousness: awake and alert Pain management: pain level controlled Vital Signs Assessment: post-procedure vital signs reviewed and stable Respiratory status: spontaneous breathing, nonlabored ventilation and respiratory function stable Cardiovascular status: blood pressure returned to baseline and stable Postop Assessment: no apparent nausea or vomiting Anesthetic complications: no   No notable events documented.  Last Vitals:  Vitals:   02/17/21 1730 02/17/21 1745  BP: 137/81 139/80  Pulse: 72 65  Resp: 13 15  Temp:  36.5 C  SpO2:  95%    Last Pain:  Vitals:   02/17/21 1745  TempSrc: Axillary  PainSc:    Pain Goal:                Epidural/Spinal Function Cutaneous sensation: Pins and Needles (02/17/21 1745), Patient able to flex knees: Yes (02/17/21 1745), Patient able to lift hips off bed: No (02/17/21 1745), Back pain beyond tenderness at insertion site: No (02/17/21 1745), Progressively worsening motor and/or sensory loss: No (02/17/21 1745), Bowel and/or bladder incontinence post epidural: No (02/17/21 1745)  Lowella Curb

## 2021-02-17 NOTE — Progress Notes (Signed)
S: Pt notes still with HA 9/10,. Unrelieved with tylenol, fiorcet, oxycodone. Unlike any HA she has had before. H/o migraines when younger but this is unlike migraines, which she has not had in several years. Still notes loss of central vision in L eye, though this has not changed snce her presentation 1 wk ago.   O:  Pleasant, obese, non-toxic but slight general edema Abd; gravid, obese, no RUQ pain LE: 1+ edema, 3+ DTR (on Mag x 4 hrs), no clonus GU: def  NST: eactive Toco none.    Vitals:   02/17/21 1152 02/17/21 1216 02/17/21 1314 02/17/21 1330  BP: (!) 166/94 (!) 151/91 (!) 174/103 (!) 154/94  Pulse: 68 72 79 79  Resp: 16  18 17   Temp: 98 F (36.7 C)     TempSrc: Oral     SpO2: 97%   98%  Weight:      Height:        CBC Latest Ref Rng & Units 02/17/2021 02/16/2021 02/15/2021  WBC 4.0 - 10.5 K/uL 12.9(H) 11.4(H) 11.2(H)  Hemoglobin 12.0 - 15.0 g/dL 04/18/2021 11.9(L) 11.8(L)  Hematocrit 36.0 - 46.0 % 40.0 35.7(L) 35.7(L)  Platelets 150 - 400 K/uL 210 201 205   CMP Latest Ref Rng & Units 02/17/2021 02/16/2021 02/15/2021  Glucose 70 - 99 mg/dL 04/18/2021) 92 86  BUN 6 - 20 mg/dL 11 13 16   Creatinine 0.44 - 1.00 mg/dL 564(P ) 3.29  Sodium 135 - 145 mmol/L 135 134(L) 135  Potassium 3.5 - 5.1 mmol/L 4.2 4.5 4.3  Chloride 98 - 111 mmol/L 104 106 105  CO2 22 - 32 mmol/L 23 21(L) 22  Calcium 8.9 - 10.3 mg/dL 5.18(A) 4.16) 6.0(Y)  Total Protein 6.5 - 8.1 g/dL 3.0(Z) 5.1(L) 5.2(L)  Total Bilirubin 0.3 - 1.2 mg/dL 0.4 0.4 0.6  Alkaline Phos 38 - 126 U/L 86 65 66  AST 15 - 41 U/L 32 28 23  ALT 0 - 44 U/L 23 20 22     A/P: Persistent severe PEC now with severe HA and htn despite bp meds and narcotics. While labs stable, HA concerning, especially as she has acute loss of central left vision 1 wk ago. Will proceed with delivery. Case d/w MFM - fetal testing reassuring, prematurity risks, s/p 4 hrs mag for neuroprotection, s/p BMZ, NICU aware and s/p NICU consult. - PCS, d/w pt may need vertical  incison  - PP VTE prophylaxis- Lovvenox at 24 hrs - Vision loss, PP head CT  6.0(F 02/17/2021 3:07 PM

## 2021-02-17 NOTE — Anesthesia Procedure Notes (Signed)
Spinal  Patient location during procedure: OB Start time: 02/17/2021 3:17 PM End time: 02/17/2021 3:22 PM Reason for block: surgical anesthesia Staffing Performed: anesthesiologist  Anesthesiologist: Lowella Curb, MD Preanesthetic Checklist Completed: patient identified, IV checked, risks and benefits discussed, surgical consent, monitors and equipment checked, pre-op evaluation and timeout performed Spinal Block Patient position: sitting Prep: DuraPrep and site prepped and draped Patient monitoring: heart rate, cardiac monitor, continuous pulse ox and blood pressure Approach: midline Location: L3-4 Injection technique: single-shot Needle Needle type: Pencan  Needle gauge: 24 G Needle length: 10 cm Assessment Sensory level: T4 Events: CSF return

## 2021-02-17 NOTE — Lactation Note (Signed)
This note was copied from a baby's chart. Lactation Consultation Note  Patient Name: Renee Mcintyre Date: 02/17/2021   Age:25 hours  Attempted to visit with family, but only found baby in her room 342 getting her X-rays. Will attempt to visit with mom once she gets transferred to her permanent room, she had a C-section and still in recovery.  Maternal Data    Feeding    LATCH Score                    Lactation Tools Discussed/Used    Interventions    Discharge    Consult Status      Nekhi Liwanag Venetia Constable 02/17/2021, 6:07 PM

## 2021-02-17 NOTE — Lactation Note (Signed)
This note was copied from a baby's chart. Lactation Consultation Note  Patient Name: Renee Mcintyre TDVVO'H Date: 02/17/2021 Reason for consult: Initial assessment;Primapara;1st time breastfeeding;Preterm <34wks;NICU baby;Infant < 6lbs Age:25 hours  Visited with mom of 4 hours old pre-term NICU female, she's a P1 and reported (+) breast changes during the pregnancy. LC assisted with hand expression (colostrum noted) and pumping, mom started pumping during [facility] consultation, praised her for her efforts.  Reviewed pumping schedule, lactogenesis II and benefits of premature milk for NICU babies.   Plan of care:  Encouraged mom to pump every 2-3 hours, at least 8 pumping sessions/24 hours She'll add coconut oil, breast massage and hand expression prior pumping  FOB present and very supportive. Parents reported all questions and concerns were answered, they're both aware of LC OP services and will call PRN.  Maternal Data Has patient been taught Hand Expression?: Yes Does the patient have breastfeeding experience prior to this delivery?: No  Feeding Mother's Current Feeding Choice: Breast Milk  LATCH Score                    Lactation Tools Discussed/Used Tools: Pump;Flanges;Coconut oil Flange Size: 24 Breast pump type: Double-Electric Breast Pump Pump Education: Setup, frequency, and cleaning;Milk Storage Reason for Pumping: premature NICU baby < 3 lbs Pumping frequency: q 3 hours  Interventions Interventions: Breast feeding basics reviewed;Breast massage;Hand express;DEBP;Education;Coconut oil  Discharge Pump: Personal;DEBP (Spectra DEBP, already ordered it through insurance) WIC Program: No  Consult Status Consult Status: Follow-up Date: 02/18/21 Follow-up type: In-patient    Wilhelm Ganaway Venetia Constable 02/17/2021, 8:29 PM

## 2021-02-17 NOTE — Progress Notes (Signed)
Pt c/o headache, 9-10/10; no new vision changes, still with central vision deficit left eye; during admission has had mild headaches, relieved with tylenol or fioricet; h/o migraine in middle school, was on prophylactic meds for a while; this h/a is more painful than her usual headaches; started about 0300 and had fioricet but didn't really help; took oxycodone but made her nauseous/vomit, zofran and then did take oxycodone around 0700, no further nausea No sob/cp, no n/v, says her LE edema is much improved sine admission +FM, no ctx/vb/lof  Patient Vitals for the past 24 hrs:  BP Temp Temp src Pulse Resp SpO2 Weight  02/17/21 0800 (!) 153/101 -- -- 75 -- 97 % --  02/17/21 0741 (!) 165/97 98 F (36.7 C) Oral 78 18 99 % --  02/17/21 0536 (!) 157/102 -- -- 86 -- -- --  02/17/21 0419 -- -- -- -- -- -- 119.2 kg  02/17/21 0410 -- -- -- -- -- -- 119.2 kg  02/17/21 0317 (!) 156/99 98 F (36.7 C) Oral 83 18 100 % --  02/16/21 2301 (!) 145/90 97.7 F (36.5 C) Oral 81 18 100 % --  02/16/21 2012 (!) 159/98 -- -- 85 -- -- --  02/16/21 1930 (!) 173/95 98 F (36.7 C) Oral 85 18 100 % --  02/16/21 1918 (!) 179/94 -- -- 86 -- -- --  02/16/21 1523 (!) 157/91 97.9 F (36.6 C) Oral 83 18 100 % --  02/16/21 1139 (!) 154/90 98 F (36.7 C) Oral 75 18 100 % --     Intake/Output Summary (Last 24 hours) at 02/17/2021 8315 Last data filed at 02/17/2021 0741 Gross per 24 hour  Intake 1920 ml  Output 4225 ml  Net -2305 ml   A&ox3 RRR Ctab Abd: soft, nt, nd; gravid LE: tr/+1 LE bilat, no edema DTR: 2+  FHT: 120s, nml variability, +accels, no decels TOCO: no decels  U/s (7/5): bpp 8/8, cephalic, anterior placenta, afi 22cm; right pyelectasis  A/P: G1 at 28.5 wga, hd#8 Severe pre-eclampsia - now with new severe headache, bps mild range consistently (had 2 severe range and mild range on repeat), not requiring iv meds in last 24 hr; on procardia now with 600mg  labetalol; h/a not relieved with fioricet then  oxycodone, h/o migraines in remote past, this is different than other h/a; I have reviewed patient with MFM (Dr ) and recommendation to begin magnesium sulfate for 4 hrs and reassess headache - if persists, proceed with delivery, if resolves, continue for 12hrs then d/c; labs repeated this am and CBC stable, plts wnl; lfts wnl, creatinine decreased slightly and now 0.95 (stable from admission); patient and husband aware of plan of care and delivery if h/a persists; pt had dinner last night, crackers this am around 0600 and has been NPO since; in addition for seizure prophylaxis, benefit of fetal neuroprotection discussed with pt and husband; NICU informed of planning to move to delivery and plan to reassess in 4 hr; bmz #2 7/2; continue close observation, strict I/O's; I have discussed c/s with patient if move to delivery, discussed risk of bleeding (possible transfusion), infection, injury to bowel/bladder/nerves/blood vessels,risk of further surgery; risk of anesthesia, risk of blood clot to leg/lung Fetal status reassuring - will begin continuous monitoring now, previously NST q shift; efw 2'15" (82%) (7/2 u/s)

## 2021-02-18 DIAGNOSIS — Z6791 Unspecified blood type, Rh negative: Secondary | ICD-10-CM

## 2021-02-18 DIAGNOSIS — O26899 Other specified pregnancy related conditions, unspecified trimester: Secondary | ICD-10-CM

## 2021-02-18 LAB — CBC WITH DIFFERENTIAL/PLATELET
Abs Immature Granulocytes: 0.09 10*3/uL — ABNORMAL HIGH (ref 0.00–0.07)
Basophils Absolute: 0 10*3/uL (ref 0.0–0.1)
Basophils Relative: 0 %
Eosinophils Absolute: 0 10*3/uL (ref 0.0–0.5)
Eosinophils Relative: 0 %
HCT: 37.1 % (ref 36.0–46.0)
Hemoglobin: 12.7 g/dL (ref 12.0–15.0)
Immature Granulocytes: 1 %
Lymphocytes Relative: 13 %
Lymphs Abs: 2.3 10*3/uL (ref 0.7–4.0)
MCH: 31.5 pg (ref 26.0–34.0)
MCHC: 34.2 g/dL (ref 30.0–36.0)
MCV: 92.1 fL (ref 80.0–100.0)
Monocytes Absolute: 1 10*3/uL (ref 0.1–1.0)
Monocytes Relative: 6 %
Neutro Abs: 14.7 10*3/uL — ABNORMAL HIGH (ref 1.7–7.7)
Neutrophils Relative %: 80 %
Platelets: 220 10*3/uL (ref 150–400)
RBC: 4.03 MIL/uL (ref 3.87–5.11)
RDW: 14.4 % (ref 11.5–15.5)
WBC: 18.1 10*3/uL — ABNORMAL HIGH (ref 4.0–10.5)
nRBC: 0 % (ref 0.0–0.2)

## 2021-02-18 LAB — COMPREHENSIVE METABOLIC PANEL
ALT: 21 U/L (ref 0–44)
AST: 28 U/L (ref 15–41)
Albumin: 2.4 g/dL — ABNORMAL LOW (ref 3.5–5.0)
Alkaline Phosphatase: 80 U/L (ref 38–126)
Anion gap: 7 (ref 5–15)
BUN: 20 mg/dL (ref 6–20)
CO2: 23 mmol/L (ref 22–32)
Calcium: 7.9 mg/dL — ABNORMAL LOW (ref 8.9–10.3)
Chloride: 100 mmol/L (ref 98–111)
Creatinine, Ser: 1.2 mg/dL — ABNORMAL HIGH (ref 0.44–1.00)
GFR, Estimated: >60 mL/min (ref >60)
Glucose, Bld: 103 mg/dL — ABNORMAL HIGH (ref 70–99)
Potassium: 4.8 mmol/L (ref 3.5–5.1)
Sodium: 130 mmol/L — ABNORMAL LOW (ref 135–145)
Total Bilirubin: 0.4 mg/dL (ref 0.3–1.2)
Total Protein: 4.9 g/dL — ABNORMAL LOW (ref 6.5–8.1)

## 2021-02-18 MED ORDER — PRENATAL MULTIVITAMIN CH
1.0000 | ORAL_TABLET | Freq: Every day | ORAL | Status: DC
  Administered 2021-02-18 – 2021-02-21 (×4): 1 via ORAL
  Filled 2021-02-18 (×4): qty 1

## 2021-02-18 MED ORDER — NIFEDIPINE ER OSMOTIC RELEASE 30 MG PO TB24
30.0000 mg | ORAL_TABLET | Freq: Every day | ORAL | Status: DC
  Administered 2021-02-18 – 2021-02-19 (×2): 30 mg via ORAL
  Filled 2021-02-18 (×3): qty 1

## 2021-02-18 MED ORDER — ENOXAPARIN SODIUM 40 MG/0.4ML IJ SOSY
40.0000 mg | PREFILLED_SYRINGE | INTRAMUSCULAR | Status: DC
  Administered 2021-02-18 – 2021-02-21 (×4): 40 mg via SUBCUTANEOUS
  Filled 2021-02-18 (×4): qty 0.4

## 2021-02-18 MED ORDER — FAMOTIDINE 20 MG PO TABS
20.0000 mg | ORAL_TABLET | Freq: Every day | ORAL | Status: DC | PRN
  Administered 2021-02-20: 20 mg via ORAL
  Filled 2021-02-18: qty 1

## 2021-02-18 NOTE — Progress Notes (Signed)
Interval note: Received call from RN, pt. Is now s/p ophthalmology consult and pending note reviewed with Dr. Juliene Pina. Plan to restart Lovenox 40mg  SQ q 24hrs since no contraindication per ophthalmology. VSS, last BP mild range. Plan to start Procardia 30mg  XL daily. Rhogam pending workup, but plan for today. POC in consult with Dr. .  , CNM

## 2021-02-18 NOTE — Lactation Note (Signed)
This note was copied from a baby's chart. Lactation Consultation Note Mother is pumping frequently. Yield is wnl today. We reviewed LC services and IDF when baby [name] is ready.   Patient Name: Renee Mcintyre MOLMB'E Date: 02/18/2021 Reason for consult: Follow-up assessment Age:25 hours  Maternal Data  Pumping frequency: q3  Feeding Mother's Current Feeding Choice: Breast Milk and Donor Milk  Interventions Interventions: Education  Consult Status Consult Status: Follow-up Follow-up type: In-patient  Elder Negus, MA IBCLC 02/18/2021, 5:06 PM

## 2021-02-18 NOTE — Progress Notes (Addendum)
POSTOPERATIVE DAY # 1 S/P Primary LTCS for severe pre-eclampsia at [redacted]w[redacted]d, baby girl "Renee Mcintyre"    S:         Reports feeling much better today. Reports headache has completely resolved. Loss of vision in left eye still present, but does report she has some peripheral vision, but no central vision. Denies RUQ/epigastric pain. Denies incisional pain.              Tolerating po intake / no nausea / no vomiting / no flatus / no BM  Denies dizziness, SOB, or CP             Bleeding is light             Pain controlled with Tylenol and Motrin             Up ad lib / ambulatory/ voiding QS x 2 since foley removal at 10pm last night  Newborn stable on CPAP in NICU, mom pumping    O:  VS: BP 128/71 (BP Location: Right Arm)   Pulse 69   Temp 97.7 F (36.5 C) (Oral)   Resp 18   Ht 5\' 5"  (1.651 m)   Wt 113.6 kg   SpO2 97%   Breastfeeding Unknown   BMI 41.68 kg/m  Patient Vitals for the past 24 hrs:  BP Temp Temp src Pulse Resp SpO2 Weight  02/18/21 0755 128/71 -- -- 69 18 97 % --  02/18/21 0425 -- -- -- -- -- -- 113.6 kg  02/18/21 0349 -- -- -- -- -- 98 % --  02/18/21 0344 -- -- -- -- -- 98 % --  02/18/21 0342 (!) 143/80 97.7 F (36.5 C) Oral 75 18 98 % --  02/17/21 2306 131/75 98.2 F (36.8 C) Oral 73 18 -- --  02/17/21 1937 (!) 150/81 97.9 F (36.6 C) Oral 74 16 99 % --  02/17/21 1828 140/81 97.7 F (36.5 C) Axillary 67 16 97 % --  02/17/21 1815 (!) 147/86 -- -- 70 15 -- --  02/17/21 1800 137/85 -- -- 65 13 94 % --  02/17/21 1745 139/80 97.7 F (36.5 C) Axillary 65 15 95 % --  02/17/21 1730 137/81 -- -- 72 13 -- --  02/17/21 1715 133/77 98 F (36.7 C) Oral 70 15 97 % --  02/17/21 1700 128/78 -- -- 73 17 96 % --  02/17/21 1648 132/75 97.9 F (36.6 C) Oral 67 18 -- --  02/17/21 1330 (!) 154/94 -- -- 79 17 98 % --  02/17/21 1314 (!) 174/103 -- -- 79 18 -- --  02/17/21 1216 (!) 151/91 -- -- 72 -- -- --  02/17/21 1152 (!) 166/94 98 F (36.7 C) Oral 68 16 97 % --  02/17/21 1057 (!)  148/89 -- -- 78 -- -- --  02/17/21 1046 (!) 152/91 -- -- 81 16 97 % --  02/17/21 1036 (!) 158/89 -- -- 68 16 98 % --      LABS:  Results for orders placed or performed during the hospital encounter of 02/11/21 (from the past 24 hour(s))  CBC with Differential/Platelet     Status: Abnormal   Collection Time: 02/18/21  7:17 AM  Result Value Ref Range   WBC 18.1 (H) 4.0 - 10.5 K/uL   RBC 4.03 3.87 - 5.11 MIL/uL   Hemoglobin 12.7 12.0 - 15.0 g/dL   HCT 04/21/21 83.3 - 82.5 %   MCV 92.1 80.0 - 100.0 fL   MCH 31.5 26.0 -  34.0 pg   MCHC 34.2 30.0 - 36.0 g/dL   RDW 41.9 37.9 - 02.4 %   Platelets 220 150 - 400 K/uL   nRBC 0.0 0.0 - 0.2 %   Neutrophils Relative % 80 %   Neutro Abs 14.7 (H) 1.7 - 7.7 K/uL   Lymphocytes Relative 13 %   Lymphs Abs 2.3 0.7 - 4.0 K/uL   Monocytes Relative 6 %   Monocytes Absolute 1.0 0.1 - 1.0 K/uL   Eosinophils Relative 0 %   Eosinophils Absolute 0.0 0.0 - 0.5 K/uL   Basophils Relative 0 %   Basophils Absolute 0.0 0.0 - 0.1 K/uL   Immature Granulocytes 1 %   Abs Immature Granulocytes 0.09 (H) 0.00 - 0.07 K/uL  Comprehensive metabolic panel     Status: Abnormal   Collection Time: 02/18/21  7:17 AM  Result Value Ref Range   Sodium 130 (L) 135 - 145 mmol/L   Potassium 4.8 3.5 - 5.1 mmol/L   Chloride 100 98 - 111 mmol/L   CO2 23 22 - 32 mmol/L   Glucose, Bld 103 (H) 70 - 99 mg/dL   BUN 20 6 - 20 mg/dL   Creatinine, Ser 0.97 (H) 0.44 - 1.00 mg/dL   Calcium 7.9 (L) 8.9 - 10.3 mg/dL   Total Protein 4.9 (L) 6.5 - 8.1 g/dL   Albumin 2.4 (L) 3.5 - 5.0 g/dL   AST 28 15 - 41 U/L   ALT 21 0 - 44 U/L   Alkaline Phosphatase 80 38 - 126 U/L   Total Bilirubin 0.4 0.3 - 1.2 mg/dL   GFR, Estimated >35 >32 mL/min   Anion gap 7 5 - 15                 Bloodtype: --/--/O NEG (07/06 1204)  Rubella:                                               I&O: Intake/Output      07/07 0701 07/08 0700 07/08 0701 07/09 0700   P.O. 960 480   I.V. (mL/kg) 1595.8 (14)    IV  Piggyback 150    Total Intake(mL/kg) 2705.8 (23.8) 480 (4.2)   Urine (mL/kg/hr) 2550 (0.9) 200 (0.9)   Emesis/NG output 0    Blood 251    Total Output 2801 200   Net -95.2 +280        Emesis Occurrence 1 x                 Physical Exam:             Alert and Oriented X3  Lungs: Clear and unlabored  Heart: regular rate and rhythm / no murmurs  Abdomen: soft, non-tender, obese, moderate gaseous distention, hypoactive bowel sounds in all quadrants              Fundus: firm, non-tender, U-4             Dressing: honeycomb with steri-strips, sanguinous drainage noted on left side of dressing about 40% saturated               Incision:  approximated with sutures / no erythema / no ecchymosis / no active drainage  Perineum: intact  Lochia: appropriate  Extremities: +1 LE edema, no calf pain or tenderness, SCDs on, +3DTRS bilaterally, no clonus bilaterally  A/P:     POD # 1 S/P Primary LTCS for severe PEC at [redacted]w[redacted]d            Principal Problem:   Postpartum care following cesarean delivery 7/7 Active Problems:   Morbid obesity (HCC)   - On Lovenox SQ daily and SCDs in place   - Ambulation encouraged    Preeclampsia, severe, third trimester    - worsening elevated serum creatinine this AM   - Daily weights and Strict I&O   - No s/s of mag toxicity    - Repeat CBC and CMP in AM    - Continue Magnesium sulfate until 1553 today    - BPs stable off Labetalol    Primary low transverse cesarean section - severe PEC    - Change honeycomb dressing    Vision loss of left eye   - Dr. Ernestina Penna to come assess and order neurology consult    RH negative/ Baby RH positive   - Rhogam today    Lactation support PRN  Encouraged warm liquids and ambulation to promote bowel motility; avoid straw  Encouraged to void every 2-3 hours   Continue current care  POC in consult with Dr. Roosvelt Maser, MSN, CNM [facility]    Pt also seen and examined. Pt reports  improvement in HA but still loss of central vision in her left eye. No scotomata, no RUQ pain. Overall c/s pain well controlled, foley out. Has been up to NICU to see baby. Ambulating w/o difficulty.No CP or SOB.  Pt notes h/o migraine when younger but none in many years. No personal or FH VTE, MS or other neurologic diseases. Pt notes no motor or sensory defects.  O: Vitals reviewed CN 2-12 intact  A/P: Resolving PEC, following Cr, stop Mag at 24 hrs, watch bps closely especially as Mag stopped. Currenlty not on any bp meds but would start labetalol if needed. -baby in NICU -start Lovenox for VTE prophylaxis, pending optho consult -Central vision loss in Left eye 1 wk ago. Spoke with neurology who did not think unilateral vision loss would be related to a central process. They recc starting with opthalmology consult for bedside fundoscopic exam. If imaging needed they recc MRI w/ and w/o contrast and head and orbits. Currently on hold with optho.   Lendon Colonel 02/18/2021 11:34 AM

## 2021-02-18 NOTE — Consult Note (Signed)
Ophthalmology Consult  This is a 25 yo female with a h/o decreased central vision in the left eye since last Friday.  She began having headaches at this time and was found to have elevated blood pressure and preeclampsia.  Pt delivered yesterday and still has decreased vision in the left eye.  The decreased vision is only in central vision of the left eye and the peripheral vision is good.  She has no other ocular complaints.  On examination pt was found to be 20/20 in the right eye and could get the 1st letter of the 20/40 line in the left eye when looking to end of the line.  She could not see any other letters.  PERRL, EOMI, and pt had full ductions and versions.  Pt had full confrontational visual field in both eyes.  IOP was 16 in both eyes.  Slit lamp was normal in both eyes and on dilated exam c/d was 0.4 in both eyes and optic nerve margins were crisp without edema.  The right eye fundus exam was normal however pt had small amount of bleeding in the macular region of the left eye.  The rest of the dilated exam was normal.  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  The rest of the exam was within normal limits.  Pt does not have any systemic diseases to explain findings.  Hypertension would tend to cause bilateral findings with flame hemorrhages etc.   This is generally self limiting and vision tends to recover in a few weeks to a few months. The bleeding was minimal and is over a week old based on symptoms so no contraindication to anticoagulants based on retinopathy.  Pt should see me in my office on Tuesday at 10:00 am for further tests and repeat dilated exam.    Thank you for allowing me to participate in the care of this patient.  Please feel free to contact me if you have any concerns.  Mia [place], M.D. (Cell) [phone] (Office) [phone]

## 2021-02-19 LAB — CBC WITH DIFFERENTIAL/PLATELET
Abs Immature Granulocytes: 0.06 10*3/uL (ref 0.00–0.07)
Basophils Absolute: 0 10*3/uL (ref 0.0–0.1)
Basophils Relative: 0 %
Eosinophils Absolute: 0.1 10*3/uL (ref 0.0–0.5)
Eosinophils Relative: 1 %
HCT: 36.9 % (ref 36.0–46.0)
Hemoglobin: 12.3 g/dL (ref 12.0–15.0)
Immature Granulocytes: 0 %
Lymphocytes Relative: 27 %
Lymphs Abs: 3.8 10*3/uL (ref 0.7–4.0)
MCH: 31.3 pg (ref 26.0–34.0)
MCHC: 33.3 g/dL (ref 30.0–36.0)
MCV: 93.9 fL (ref 80.0–100.0)
Monocytes Absolute: 1 10*3/uL (ref 0.1–1.0)
Monocytes Relative: 7 %
Neutro Abs: 9.3 10*3/uL — ABNORMAL HIGH (ref 1.7–7.7)
Neutrophils Relative %: 65 %
Platelets: 205 10*3/uL (ref 150–400)
RBC: 3.93 MIL/uL (ref 3.87–5.11)
RDW: 15.2 % (ref 11.5–15.5)
WBC: 14.2 10*3/uL — ABNORMAL HIGH (ref 4.0–10.5)
nRBC: 0 % (ref 0.0–0.2)

## 2021-02-19 LAB — COMPREHENSIVE METABOLIC PANEL
ALT: 18 U/L (ref 0–44)
AST: 18 U/L (ref 15–41)
Albumin: 2.3 g/dL — ABNORMAL LOW (ref 3.5–5.0)
Alkaline Phosphatase: 66 U/L (ref 38–126)
Anion gap: 10 (ref 5–15)
BUN: 18 mg/dL (ref 6–20)
CO2: 24 mmol/L (ref 22–32)
Calcium: 8.4 mg/dL — ABNORMAL LOW (ref 8.9–10.3)
Chloride: 105 mmol/L (ref 98–111)
Creatinine, Ser: 1.01 mg/dL — ABNORMAL HIGH (ref 0.44–1.00)
GFR, Estimated: >60 mL/min (ref >60)
Glucose, Bld: 85 mg/dL (ref 70–99)
Potassium: 4.1 mmol/L (ref 3.5–5.1)
Sodium: 139 mmol/L (ref 135–145)
Total Bilirubin: 0.4 mg/dL (ref 0.3–1.2)
Total Protein: 5.1 g/dL — ABNORMAL LOW (ref 6.5–8.1)

## 2021-02-19 MED ORDER — WITCH HAZEL-GLYCERIN EX PADS: 1.0000 "application " | MEDICATED_PAD | CUTANEOUS | Status: DC | PRN

## 2021-02-19 MED ORDER — LABETALOL HCL 200 MG PO TABS
200.0000 mg | ORAL_TABLET | Freq: Two times a day (BID) | ORAL | Status: DC
  Administered 2021-02-19 (×2): 200 mg via ORAL
  Filled 2021-02-19 (×2): qty 1

## 2021-02-19 MED ORDER — NIFEDIPINE ER OSMOTIC RELEASE 30 MG PO TB24
30.0000 mg | ORAL_TABLET | ORAL | Status: AC
  Administered 2021-02-19: 30 mg via ORAL

## 2021-02-19 MED ORDER — NIFEDIPINE ER OSMOTIC RELEASE 30 MG PO TB24
30.0000 mg | ORAL_TABLET | Freq: Two times a day (BID) | ORAL | Status: DC
  Administered 2021-02-19: 30 mg via ORAL
  Filled 2021-02-19: qty 1

## 2021-02-19 MED ORDER — RHO D IMMUNE GLOBULIN 1500 UNIT/2ML IJ SOSY
300.0000 ug | PREFILLED_SYRINGE | Freq: Once | INTRAMUSCULAR | Status: AC
  Administered 2021-02-19: 300 ug via INTRAVENOUS
  Filled 2021-02-19: qty 2

## 2021-02-19 MED ORDER — DIBUCAINE (PERIANAL) 1 % EX OINT: 1.0000 "application " | TOPICAL_OINTMENT | CUTANEOUS | Status: DC | PRN

## 2021-02-19 NOTE — Progress Notes (Signed)
Pts BP is currently 157/83. She denies any PIH s/s. Pt will continue to be monitored

## 2021-02-19 NOTE — Progress Notes (Addendum)
Pts BP ranging in the 160s over low 90s. Provider notified and new orders were placed. Pts BP was rechecked 1 hr later in which BP is now 143/78. No PIH s/s present at this time. Pt will continue to be monitored.

## 2021-02-19 NOTE — Lactation Note (Signed)
This note was copied from a baby's chart. Lactation Consultation Note  Patient Name: Renee Mcintyre [id number] Date: 02/19/2021 Reason for consult: Follow-up assessment;1st time breastfeeding;Primapara;NICU baby;Preterm <34wks;Infant < 6lbs;Infant weight loss Age:25 hours  Visited with mom of 44 hours old pre-term NICU female, she's a P1 and reported (+) breast changes during the pregnancy. Mom reported she's getting colostrum through hand expression but not through pumping yet; explained to mom that milk volumes are WNL at this point.   Reviewed pumping expectations, lactogenesis II and different types of BF supplies FOB had questions about.     Plan of care:   Encouraged mom to continue pumping every 2-3 hours, at least 8 pumping sessions/24 hours She'll continue using coconut oil, breast massage and hand expression prior pumping   FOB present and very supportive. Parents reported all questions and concerns were answered, they're both aware of LC OP services and will call PRN.  Maternal Data    Feeding Mother's Current Feeding Choice: Breast Milk and Donor Milk  Lactation Tools Discussed/Used Tools: Pump;Flanges;Coconut oil Flange Size: 24 Breast pump type: Double-Electric Breast Pump Pump Education: Setup, frequency, and cleaning;Milk Storage Reason for Pumping: premature NICU infant < 3 lbs Pumping frequency: q 2-3 hours Pumped volume:  (drops)  Interventions Interventions: Breast feeding basics reviewed;DEBP;Education;Coconut oil  Discharge Pump: DEBP;Personal  Consult Status Consult Status: Follow-up Date: 02/20/21 Follow-up type: In-patient    Renee Mcintyre 02/19/2021, 12:37 PM

## 2021-02-19 NOTE — Progress Notes (Signed)
POSTOPERATIVE DAY # 2 S/P [facility] for severe pre-eclampsia at [redacted]w[redacted]d, baby girl "[name]" NICU, breast feeding plan   S:         Reports feeling well, swelling improved. HA resolved. Vision blind spot unchanged, saw Ophthalmologist and advised f/up next week. Denies RUQ/epigastric pain. Denies incisional pain.              Tolerating po intake / no nausea / no vomiting / no flatus / no BM  Denies dizziness, SOB, or CP             Bleeding is light             Pain controlled with Tylenol/Ibuprofen/ Oxycodone             Up ad lib / ambulatory/ voiding well  Newborn stable on CPAP in NICU, mom pumping Baby stable    O:  VS: BP (!) 156/86 (BP Location: Right Arm)   Pulse 74   Temp 98 F (36.7 C) (Oral)   Resp 18   Ht 5\' 5"  (1.651 m)   Wt 111.6 kg   SpO2 98%   Breastfeeding Unknown   BMI 40.94 kg/m  Patient Vitals for the past 24 hrs:  BP Temp Temp src Pulse Resp SpO2 Weight  02/19/21 0827 (!) 156/86 98 F (36.7 C) Oral 74 18 98 % --  02/19/21 0430 (!) 157/83 -- -- 91 -- -- 111.6 kg  02/19/21 0413 (!) 169/82 98 F (36.7 C) Oral 98 18 98 % --  02/19/21 0100 (!) 143/78 -- -- 71 -- -- --  02/19/21 0000 (!) 164/98 -- -- 78 -- -- --  02/18/21 2355 (!) 160/91 -- -- 88 -- -- --  02/18/21 2337 (!) 164/83 98 F (36.7 C) Axillary 74 16 -- --  02/18/21 1941 (!) 149/81 98.9 F (37.2 C) Axillary 84 18 -- --  02/18/21 1538 (!) 151/82 98 F (36.7 C) Oral 85 18 98 % --  02/18/21 1137 129/80 98.1 F (36.7 C) Oral 73 18 98 % --     CBC Latest Ref Rng & Units 02/19/2021 02/18/2021 02/17/2021  WBC 4.0 - 10.5 K/uL 14.2(H) 18.1(H) 12.9(H)  Hemoglobin 12.0 - 15.0 g/dL 04/20/2021 93.8 18.2  Hematocrit 36.0 - 46.0 % 36.9 37.1 40.0  Platelets 150 - 400 K/uL 205 220 210    CMP Latest Ref Rng & Units 02/19/2021 02/18/2021 02/17/2021  Glucose 70 - 99 mg/dL 85 04/20/2021) 716(R)  BUN 6 - 20 mg/dL 18 20 11   Creatinine 0.44 - 1.00 mg/dL 678(L) ) 3.81(O  Sodium 135 - 145 mmol/L 139 130(L) 135  Potassium 3.5 - 5.1  mmol/L 4.1 4.8 4.2  Chloride 98 - 111 mmol/L 105 100 104  CO2 22 - 32 mmol/L 24 23 23   Calcium 8.9 - 10.3 mg/dL 1.75(Z) 7.9(L) 8.7(L)  Total Protein 6.5 - 8.1 g/dL 5.1(L) 4.9(L) 5.5(L)  Total Bilirubin 0.3 - 1.2 mg/dL 0.4 0.4 0.4  Alkaline Phos 38 - 126 U/L 66 80 86  AST 15 - 41 U/L 18 28 32  ALT 0 - 44 U/L 18 21 23                    Bloodtype: --/--/O NEG (07/06 1204)  Rubella:  I&O: Intake/Output      07/08 0701 07/09 0700 07/09 0701 07/10 0700   P.O. 4100 680   I.V. (mL/kg) 1159.3 (10.4)    IV Piggyback     Total Intake(mL/kg) 5259.3 (47.1) 680 (6.1)   Urine (mL/kg/hr) 5600 (2.1) 0 (0)   Emesis/NG output     Stool 0    Blood     Total Output 5600 0   Net -340.7 +680                    Physical Exam:             Alert and Oriented X3  Lungs: Clear and unlabored  Heart: regular rate and rhythm / no murmurs  Abdomen: soft, non-tender, obese, moderate gaseous distention, hypoactive bowel sounds in all quadrants              Fundus: firm, non-tender, U-4             Dressing: honeycomb with steri-strips, no change needed             Lochia: appropriate  Extremities: +1 LE edema, no calf pain or tenderness, SCDs on, +3DTRS bilaterally, no clonus bilaterally   A/P:     POD #2 S/P [facility] for severe PEC at [redacted]w[redacted]d Preeclampsia, severe, third trimester                         - BPs rising again, Now Procardia 30mg  XL q12hr (added 2nd dose last night) and adding Labetolol 200mg  q 12 hr as well today   - creatinine improved this AM   - Daily weights and Strict I&O   - s/p magnesium, no neural s/s       Morbid obesity (HCC)   - On Lovenox SQ daily and SCDs in place   - Ambulation encouraged    Primary low transverse cesarean section    - Change honeycomb dressing    Vision loss of left eye   - f/up with Ophthalmology next week after discharge to have dilated exam     RH negative/ Baby RH positive   - Rhogam done  7/8   Lactation support PRN  Encouraged warm liquids and ambulation to promote bowel motility; avoid straw  Encouraged to void every 2-3 hours   Continue current care  Remain in-patient for better BP control   -- MD

## 2021-02-20 LAB — RH IG WORKUP (INCLUDES ABO/RH)
Fetal Screen: NEGATIVE
Gestational Age(Wks): 28.4
Unit division: 0

## 2021-02-20 MED ORDER — LABETALOL HCL 200 MG PO TABS
300.0000 mg | ORAL_TABLET | Freq: Two times a day (BID) | ORAL | Status: DC
  Administered 2021-02-20 (×2): 300 mg via ORAL
  Filled 2021-02-20 (×2): qty 1

## 2021-02-20 MED ORDER — LABETALOL HCL 5 MG/ML IV SOLN: 80.0000 mg | INTRAVENOUS | Status: DC | PRN

## 2021-02-20 MED ORDER — LABETALOL HCL 5 MG/ML IV SOLN: 40.0000 mg | INTRAVENOUS | Status: DC | PRN

## 2021-02-20 MED ORDER — LABETALOL HCL 5 MG/ML IV SOLN
INTRAVENOUS | Status: AC
  Filled 2021-02-20: qty 4

## 2021-02-20 MED ORDER — NIFEDIPINE ER OSMOTIC RELEASE 60 MG PO TB24
60.0000 mg | ORAL_TABLET | Freq: Two times a day (BID) | ORAL | Status: DC
  Administered 2021-02-20 – 2021-02-22 (×5): 60 mg via ORAL
  Filled 2021-02-20 (×5): qty 1

## 2021-02-20 MED ORDER — HYDRALAZINE HCL 20 MG/ML IJ SOLN: 10.0000 mg | INTRAMUSCULAR | Status: DC | PRN

## 2021-02-20 MED ORDER — LABETALOL HCL 5 MG/ML IV SOLN
20.0000 mg | INTRAVENOUS | Status: DC | PRN
  Administered 2021-02-20: 20 mg via INTRAVENOUS

## 2021-02-20 NOTE — Lactation Note (Signed)
This note was copied from a baby's chart. Lactation Consultation Note  Patient Name: Renee Mcintyre [id number] Date: 02/20/2021 Reason for consult: Follow-up assessment;Primapara;1st time breastfeeding;NICU baby;Preterm <34wks;Infant < 6lbs Age:25 hours  Visited with mom of 70 hours old pre-term NICU female, mom reports she's been pumping consistently and her milk supply has greatly increased this morning, is WNL.  Reviewed sensory stimulation while pumping/away from baby, lactogenesis II cycle and prevention/treatment of engorgement and sore nipples; mom might get discharged tomorrow.  Plan of care:   Encouraged mom to continue pumping every 2-3 hours, at least 8 pumping sessions/24 hours She'll continue using coconut oil, breast massage and hand expression prior pumping   FOB present and very supportive. Parents reported all questions and concerns were answered, they're both aware of LC OP services and will call PRN.  Feeding Mother's Current Feeding Choice: Breast Milk and Donor Milk  Lactation Tools Discussed/Used Tools: Pump;Flanges Flange Size: 24 Breast pump type: Double-Electric Breast Pump Pump Education: Setup, frequency, and cleaning;Milk Storage Reason for Pumping: premature NICU infant < 3 lbs Pumping frequency: q 2-3 hours Pumped volume: 60 mL  Interventions Interventions: Breast feeding basics reviewed;DEBP;Breast massage;Hand express;Education  Discharge Discharge Education: Engorgement and breast care Pump: DEBP;Personal  Consult Status Consult Status: Follow-up Date: 02/21/21 Follow-up type: In-patient    Renee Mcintyre 02/20/2021, 2:06 PM

## 2021-02-20 NOTE — Progress Notes (Signed)
POSTOPERATIVE DAY # 3 S/P [facility] for severe pre-eclampsia at [redacted]w[redacted]d, baby girl "[name]" NICU, breast feeding plan   S:         Reports feeling well, swelling improved. HA resolved. Vision blind spot unchanged, saw Ophthalmologist and advised f/up next week. Denies RUQ/epigastric pain. Denies incisional pain.                           Pain controlled with Tylenol/Ibuprofen/ Oxycodone             Up ad lib / ambulatory/ voiding well  Newborn stable on CPAP in NICU, mom pumping Baby stable    O:  VS: BP (!) 150/88 (BP Location: Right Arm)   Pulse 82   Temp 97.7 F (36.5 C) (Oral)   Resp 18   Ht 5\' 5"  (1.651 m)   Wt 110.7 kg   SpO2 100%   Breastfeeding Unknown   BMI 40.60 kg/m  Patient Vitals for the past 24 hrs:  BP Temp Temp src Pulse Resp SpO2 Weight  02/20/21 0322 (!) 150/88 97.7 F (36.5 C) Oral 82 18 100 % --  02/20/21 0012 -- -- -- -- -- -- 110.7 kg  02/19/21 2338 (!) 145/78 97.6 F (36.4 C) Oral 85 16 99 % --  02/19/21 2030 (!) 152/83 97.7 F (36.5 C) Oral (!) 103 -- 99 % --  02/19/21 1605 (!) 149/75 98.3 F (36.8 C) Oral 84 15 -- --  02/19/21 1604 -- -- -- -- -- 98 % --  02/19/21 1153 (!) 155/90 97.7 F (36.5 C) Oral 89 17 -- --  02/19/21 0827 (!) 156/86 98 F (36.7 C) Oral 74 18 98 % --     CBC Latest Ref Rng & Units 02/19/2021 02/18/2021 02/17/2021  WBC 4.0 - 10.5 K/uL 14.2(H) 18.1(H) 12.9(H)  Hemoglobin 12.0 - 15.0 g/dL 04/20/2021 51.7 00.1  Hematocrit 36.0 - 46.0 % 36.9 37.1 40.0  Platelets 150 - 400 K/uL 205 220 210    CMP Latest Ref Rng & Units 02/19/2021 02/18/2021 02/17/2021  Glucose 70 - 99 mg/dL 85 04/20/2021) 449(Q)  BUN 6 - 20 mg/dL 18 20 11   Creatinine 0.44 - 1.00 mg/dL 759(F) ) 6.38(G  Sodium 135 - 145 mmol/L 139 130(L) 135  Potassium 3.5 - 5.1 mmol/L 4.1 4.8 4.2  Chloride 98 - 111 mmol/L 105 100 104  CO2 22 - 32 mmol/L 24 23 23   Calcium 8.9 - 10.3 mg/dL 6.65(L) 7.9(L) 8.7(L)  Total Protein 6.5 - 8.1 g/dL 5.1(L) 4.9(L) 5.5(L)  Total Bilirubin 0.3 - 1.2 mg/dL  0.4 0.4 0.4  Alkaline Phos 38 - 126 U/L 66 80 86  AST 15 - 41 U/L 18 28 32  ALT 0 - 44 U/L 18 21 23      No new labs this morning since creatinine improving               Bloodtype: --/--/O NEG (07/06 1204)  Rubella:   Immune                                       I&O: Intake/Output      07/09 0701 07/10 0700 07/10 0701 07/11 0700   P.O. 2430    I.V. (mL/kg) 0 (0)    Total Intake(mL/kg) 2430 (22)    Urine (mL/kg/hr) 1500 (0.6)    Stool  Total Output 1500    Net +930                     Physical Exam:             Alert and Oriented X3  Lungs: Clear and unlabored  Heart: regular rate and rhythm / no murmurs  Abdomen: soft, non-tender, obese, moderate gaseous distention, hypoactive bowel sounds in all quadrants              Fundus: firm, non-tender, U-4             Dressing: honeycomb with steri-strips, no change needed             Lochia: appropriate  Extremities: +1 LE edema, no calf pain or tenderness, SCDs on, +3DTRS bilaterally, no clonus bilaterally   A/P:     POD # 3 S/P [facility] for severe PEC at [redacted]w[redacted]d Preeclampsia, severe, third trimester                         - BPs still not well controlled on Procardia 30mg  XL q12hr and Labetolol 200mg  q 12 hr since yesterday. Increase to Procardia 60mg  XL BID   - creatinine improved, no new labs   - Daily weights and Strict I&O   - s/p magnesium, no neural s/s       Morbid obesity (HCC)   - On Lovenox SQ daily and SCDs in place   - Ambulation encouraged    Primary low transverse cesarean section    - Change honeycomb dressing    Vision loss of left eye   - f/up with Ophthalmology next week after discharge to have dilated exam     RH negative/ Baby RH positive   - Rhogam done 7/8   Lactation support PRN  Encouraged warm liquids and ambulation to promote bowel motility; avoid straw  Encouraged to void every 2-3 hours   Continue current care  Remain in-patient for better BP control   -- MD

## 2021-02-20 NOTE — Progress Notes (Signed)
RN called with severe range BPs 30 min after I had seen the patient. No neural s/s reported.  Advised to use Labetalol IV protocol and start her Procardia 60 mg XL q12h  now.

## 2021-02-21 MED ORDER — LABETALOL HCL 200 MG PO TABS: 600.0000 mg | ORAL_TABLET | Freq: Three times a day (TID) | ORAL | Status: DC

## 2021-02-21 MED ORDER — LABETALOL HCL 200 MG PO TABS: 300.0000 mg | ORAL_TABLET | Freq: Once | ORAL | Status: DC

## 2021-02-21 MED ORDER — LABETALOL HCL 200 MG PO TABS
600.0000 mg | ORAL_TABLET | Freq: Three times a day (TID) | ORAL | Status: DC
  Administered 2021-02-21 – 2021-02-22 (×4): 600 mg via ORAL
  Filled 2021-02-21 (×4): qty 3

## 2021-02-21 NOTE — Progress Notes (Addendum)
POSTOPERATIVE DAY # 4 S/P S/P [facility] for severe pre-eclampsia at [redacted]w[redacted]d, baby girl "[name]" NICU  S:         Reports feeling much better. Denies HA. Reports left eye vision is improving, no longer seeing the black spot, just reports some blurry vision. Denies RUQ/epigastric pain. Reports swelling has improved. Incisional pain improved - taking Motrin and Tylenol only.              Tolerating po intake / no nausea / no vomiting / + flatus / several BMs  Denies dizziness, SOB, or CP             Bleeding is light             Up ad lib / ambulatory/ voiding QS without difficulty   Newborn stable on ventilator in NICU. Mom just pumped 4oz of breast milk!   O:  VS: BP (!) 147/88 (BP Location: Right Arm)   Pulse 97   Temp 98 F (36.7 C) (Oral)   Resp 18   Ht 5\' 5"  (1.651 m)   Wt 108.4 kg   SpO2 98%   Breastfeeding Unknown   BMI 39.78 kg/m  Patient Vitals for the past 24 hrs:  BP Temp Temp src Pulse Resp SpO2 Weight  02/21/21 0816 (!) 152/97 98.1 F (36.7 C) Oral (!) 104 18 98 % --  02/21/21 0512 (!) 147/88 -- -- 97 -- -- --  02/21/21 0453 (!) 165/103 -- -- (!) 108 -- -- 108.4 kg  02/20/21 2317 (!) 143/86 98 F (36.7 C) Oral 88 -- 98 % --  02/20/21 2137 (!) 156/92 98.2 F (36.8 C) Oral 90 18 -- --  02/20/21 1519 (!) 150/82 98.1 F (36.7 C) Oral (!) 104 18 96 % --  02/20/21 1152 (!) 144/85 98.2 F (36.8 C) Oral 89 18 100 % --      LABS:               Recent Labs    02/19/21 0356  WBC 14.2*  HGB 12.3  PLT 205               Bloodtype: --/--/O NEG (07/06 1204)  Rubella:        Immune                                       I&O: Intake/Output      07/10 0701 07/11 0700 07/11 0701 07/12 0700   P.O.     I.V. (mL/kg)     Total Intake(mL/kg)     Urine (mL/kg/hr) 100 (0)    Total Output 100    Net -100         Net positive 615 since admission   02/21/21 0453 108.4 kg 239.03 lbs -- 39.78 AS  02/20/21 0012 110.7 kg 244 lbs -- 40.6 KM  02/19/21 0430 111.6 kg 246.03 lbs --  40.94 CM  02/18/21 0425 113.6 kg 250.44 lbs -- 41.68 CM  02/17/21 0419 119.2 kg 262.79 lbs -- 43.73 HC  02/17/21 0410 119.2 kg 262.79 lbs -- 43.73 HC  02/11/21 1247 118.1 kg 260.3 lbs                 Physical Exam:             Alert and Oriented X3  Lungs: Clear and unlabored  Heart: regular rate and rhythm /  no murmurs  Abdomen: soft, non-tender, non-distended, obese, active bowel sounds             Fundus: firm, non-tender, below umbilicus              Dressing: honeycomb with steri-strips c/d/i              Incision:  approximated with sutures / no erythema / no ecchymosis / no drainage  Perineum: intact  Lochia: appropriate, scant rubra   Extremities: trace LE edema, no calf pain or tenderness, +2 DTRs and no clonus bilaterally   A/P: POD # 4 S/P [facility] for severe PEC at [redacted]w[redacted]d Preeclampsia, severe, third trimester                        - BPs still not well controlled on Procardia 60mg  XL BID, Labetalol 300mg  BID. Discussed with Dr. , plan to increase Labetalol to 600mg  q8hrs - creatinine improved, no new labs                         - Daily weights and Strict I&O - still net positive    - 23 pound weight loss since delivery                         - s/p magnesium, no neural s/s                     Morbid obesity (HCC)                         - On Lovenox SQ daily and SCDs in place                         - Ambulation encouraged   Primary low transverse cesarean section   Vision loss of left eye                         - f/up with Ophthalmology next week after discharge to have dilated exam    RH negative/ Baby RH positive                         - Rhogam done 7/8 Continue inpatient care for BP management  POC in consult with Dr. and was in room during assessment   Ernestina Penna, MSN, CNM [facility]    Pt also seen and examined. Encouraged by breast milk production, improvement in edema, and improvement in vision in L eye.    Bps noted  Exam as above, 3+ DTR  A/P: resolving PEC still in need of improved bp control. Already on max dose procardia 60 XL bid and will increase labetalol to 600 q 8rs.   Baby in NICU, stable.  02/21/2021 2:53 PM

## 2021-02-21 NOTE — Lactation Note (Signed)
This note was copied from a baby's chart. Lactation Consultation Note Mother is pumping frequently and with sufficient volume. She denies engorgement today.   Patient Name: Renee Mcintyre Date: 02/21/2021 Reason for consult: Follow-up assessment Age:25 days  Maternal Data  Pumping frequency: q3 average  Feeding Mother's Current Feeding Choice: Breast Milk   Lactation Tools Discussed/Used   Discharge Discharge Education: Engorgement and breast care  Consult Status Consult Status: Follow-up Follow-up type: In-patient   Elder Negus, MA IBCLC 02/21/2021, 4:22 PM

## 2021-02-21 NOTE — Plan of Care (Signed)

## 2021-02-22 ENCOUNTER — Other Ambulatory Visit (HOSPITAL_COMMUNITY): Payer: Self-pay

## 2021-02-22 MED ORDER — ACETAMINOPHEN 500 MG PO TABS
1000.0000 mg | ORAL_TABLET | Freq: Four times a day (QID) | ORAL | 0 refills | Status: DC
  Filled 2021-02-22: qty 30, 4d supply, fill #0

## 2021-02-22 MED ORDER — NIFEDIPINE ER 60 MG PO TB24
60.0000 mg | ORAL_TABLET | Freq: Two times a day (BID) | ORAL | 1 refills | Status: DC
  Filled 2021-02-22: qty 60, 30d supply, fill #0

## 2021-02-22 MED ORDER — OXYCODONE HCL 5 MG PO TABS
5.0000 mg | ORAL_TABLET | ORAL | 0 refills | Status: DC | PRN
  Filled 2021-02-22: qty 10, 2d supply, fill #0

## 2021-02-22 MED ORDER — LABETALOL HCL 300 MG PO TABS
600.0000 mg | ORAL_TABLET | Freq: Three times a day (TID) | ORAL | 1 refills | Status: DC
  Filled 2021-02-22: qty 90, 15d supply, fill #0

## 2021-02-22 NOTE — Progress Notes (Signed)
Discharge instructions given at 1223, all questions answered, and pt verbalized understanding. Pt is alert and oriented x4, ambulatory, states no pain, and vital signs stable. Pt states will go to NICU and then come back to the room to finish gathering personal items.

## 2021-02-22 NOTE — Plan of Care (Signed)
  Problem: Education: Goal: Knowledge of General Education information will improve Description: Including pain rating scale, medication(s)/side effects and non-pharmacologic comfort measures Outcome: Adequate for Discharge   Problem: Health Behavior/Discharge Planning: Goal: Ability to manage health-related needs will improve Outcome: Adequate for Discharge   Problem: Clinical Measurements: Goal: Ability to maintain clinical measurements within normal limits will improve Outcome: Adequate for Discharge Goal: Will remain free from infection Outcome: Adequate for Discharge Goal: Diagnostic test results will improve Outcome: Adequate for Discharge Goal: Respiratory complications will improve Outcome: Adequate for Discharge Goal: Cardiovascular complication will be avoided Outcome: Adequate for Discharge   Problem: Activity: Goal: Risk for activity intolerance will decrease Outcome: Adequate for Discharge   Problem: Nutrition: Goal: Adequate nutrition will be maintained Outcome: Adequate for Discharge   Problem: Coping: Goal: Level of anxiety will decrease Outcome: Adequate for Discharge   Problem: Elimination: Goal: Will not experience complications related to bowel motility Outcome: Adequate for Discharge Goal: Will not experience complications related to urinary retention Outcome: Adequate for Discharge   Problem: Safety: Goal: Ability to remain free from injury will improve Outcome: Adequate for Discharge   Problem: Skin Integrity: Goal: Risk for impaired skin integrity will decrease Outcome: Adequate for Discharge   Problem: Coping: Goal: Ability to identify and utilize available resources and services will improve Outcome: Adequate for Discharge   Problem: Life Cycle: Goal: Chance of risk for complications during the postpartum period will decrease Outcome: Adequate for Discharge   Problem: Role Relationship: Goal: Ability to demonstrate positive interaction  with newborn will improve Outcome: Adequate for Discharge   Problem: Skin Integrity: Goal: Demonstration of wound healing without infection will improve Outcome: Adequate for Discharge   Problem: Education: Goal: Knowledge of disease or condition will improve Outcome: Adequate for Discharge Goal: Knowledge of the prescribed therapeutic regimen will improve Outcome: Adequate for Discharge   Problem: Fluid Volume: Goal: Peripheral tissue perfusion will improve Outcome: Adequate for Discharge   Problem: Clinical Measurements: Goal: Complications related to disease process, condition or treatment will be avoided or minimized Outcome: Adequate for Discharge

## 2021-02-22 NOTE — Discharge Summary (Signed)
OB Discharge Summary  Patient Name: Renee Mcintyre DOB: 16-Aug-1995 MRN: [id number]  Date of admission: 02/11/2021 Delivering provider: Noland Fordyce   Admitting diagnosis: Preeclampsia, severe, third trimester [O14.13] Intrauterine pregnancy: [redacted]w[redacted]d     Secondary diagnosis: Patient Active Problem List   Diagnosis Date Noted   Rh negative status during pregnancy 02/18/2021   Primary low transverse cesarean section - severe PEC 02/17/2021   Postpartum care following cesarean delivery 7/7 02/17/2021   Vision loss of left eye 02/17/2021   Preeclampsia, severe, third trimester 02/11/2021   Morbid obesity (HCC) 06/10/2019    Date of discharge: 02/22/2021   Discharge diagnosis: Principal Problem:   Postpartum care following cesarean delivery 7/7 Active Problems:   Morbid obesity (HCC)   Preeclampsia, severe, third trimester   Primary low transverse cesarean section - severe PEC   Vision loss of left eye   Rh negative status during pregnancy                                                           Post partum procedures: Mag sulfate x 24hrs, multiple PO agents for BP  Augmentation: N/A Pain control: Spinal  Laceration:None  Episiotomy:None  Complications: None  Hospital course:  Unscheduled C/S   25 y.o. yo G1P0101 at [redacted]w[redacted]d was admitted to the hospital 02/11/2021 for scheduled cesarean section with the following indication: Severe preeclampsia Delivery details are as follows:  Membrane Rupture Time/Date: 3:52 PM ,02/17/2021   Delivery Method:C-Section, Low Transverse  Details of operation can be found in separate operative note.  Patient had a postpartum course complicated by severe preeclampsia. She had 24 hours of magnesium sulfate after delivery and will continue Procardia XL 60mg  BID and labetalol 600mg  TID. She is ambulating, tolerating a regular diet, passing flatus, and urinating well. Patient is discharged home in stable condition on  02/22/21        Newborn Data: Birth  date:02/17/2021  Birth time:3:53 PM  Gender:Female  Living status:Living  Apgars:6 ,8  Weight:1270 g     Physical exam  Vitals:   02/22/21 0437 02/22/21 0738 02/22/21 0742 02/22/21 0818  BP: 117/65  114/69 117/67  Pulse: 97  86 90  Resp: 16  16 17   Temp: 98.2 F (36.8 C)  97.6 F (36.4 C) 97.7 F (36.5 C)  TempSrc: Oral  Oral Oral  SpO2: 97%  96% 97%  Weight:  106.1 kg    Height:       General: alert and cooperative Lochia: appropriate Uterine Fundus: firm Incision: Dressing is clean, dry, and intact DVT Evaluation: No evidence of DVT seen on physical exam. Labs: Lab Results  Component Value Date   WBC 14.2 (H) 02/19/2021   HGB 12.3 02/19/2021   HCT 36.9 02/19/2021   MCV 93.9 02/19/2021   PLT 205 02/19/2021   CMP Latest Ref Rng & Units 02/19/2021  Glucose 70 - 99 mg/dL 85  BUN 6 - 20 mg/dL 18  Creatinine 04/22/2021 - 04/22/2021 mg/dL 04/22/2021)  Sodium 9.89 - 2.11 mmol/L 139  Potassium 3.5 - 5.1 mmol/L 4.1  Chloride 98 - 111 mmol/L 105  CO2 22 - 32 mmol/L 24  Calcium 8.9 - 10.3 mg/dL 9.41(D)  Total Protein 6.5 - 8.1 g/dL 5.1(L)  Total Bilirubin 0.3 - 1.2 mg/dL 0.4  Alkaline Phos 38 -  126 U/L 66  AST 15 - 41 U/L 18  ALT 0 - 44 U/L 18   Edinburgh Postnatal Depression Scale Screening Tool 02/17/2021  I have been able to laugh and see the funny side of things. 0  I have looked forward with enjoyment to things. 0  I have blamed myself unnecessarily when things went wrong. 1  I have been anxious or worried for no good reason. 2  I have felt scared or panicky for no good reason. 1  Things have been getting on top of me. 0  I have been so unhappy that I have had difficulty sleeping. 0  I have felt sad or miserable. 1  I have been so unhappy that I have been crying. 1  The thought of harming myself has occurred to me. 0  Edinburgh Postnatal Depression Scale Total 6   Vaccines: TDaP UTD         COVID-19  UTD  Discharge instructions:  per After Visit Summary  After Visit Meds:   Allergies as of 02/22/2021   No Known Allergies      Medication List     STOP taking these medications    famotidine 20 MG tablet Commonly known as: PEPCID       TAKE these medications    acetaminophen 500 MG tablet Commonly known as: TYLENOL Take 2 tablets (1,000 mg total) by mouth every 6 (six) hours.   labetalol 300 MG tablet Commonly known as: NORMODYNE Take 2 tablets (600 mg total) by mouth every 8 (eight) hours.   NIFEdipine 60 MG 24 hr tablet Commonly known as: ADALAT CC Take 1 tablet (60 mg total) by mouth every 12 (twelve) hours.   oxyCODONE 5 MG immediate release tablet Commonly known as: Oxy IR/ROXICODONE Take 1-2 tablets (5-10 mg total) by mouth every 4 (four) hours as needed for moderate pain.   prenatal multivitamin Tabs tablet Take 1 tablet by mouth daily at 12 noon.       Diet: regular  Activity: Advance as tolerated. Pelvic rest for 6 weeks.   Newborn Data: Live born female  Birth Weight: 2 lb 12.8 oz (1270 g) APGAR: 6, 8  Newborn Delivery   Birth date/time: 02/17/2021 15:53:00 Delivery type: C-Section, Low Transverse Trial of labor: No C-section categorization: Primary     Named Raynelle Chary Feeding:  pumping for baby in NICU Disposition:NICU  Delivery Report:  Review the Delivery Report for details.    Follow up:  Follow-up Information     Noland Fordyce, MD. Schedule an appointment as soon as possible for a visit in 2 day(s).   Specialty: Obstetrics and Gynecology Contact information: [address] [place] [address] [phone]                Clancy Gourd, MSN 02/22/2021, 9:33 AM

## 2021-02-22 NOTE — Lactation Note (Signed)
This note was copied from a baby's chart. Lactation Consultation Note Mother will d/c today. She continues to pump frequently and without s/s engorgement. Her milk supply is wnl. LC will plan f/u visit in NICU. Mother is aware of LC services.   POC: Mom will continue to pump q3 during the day with 6-hour break to sleep at night  Patient Name: Renee Mcintyre [id number] Date: 02/22/2021 Reason for consult: Follow-up assessment;Other (Comment) (maternal d/c) Age:25 days  Maternal Data  Pumping frequency: q3 during day with 6-hour break at night. average  Feeding Mother's Current Feeding Choice: Breast Milk  Interventions Interventions: Education Pumping  IDF  Discharge Discharge Education: Engorgement and breast care  Consult Status Consult Status: Follow-up Follow-up type: In-patient   Elder Negus, MA IBCLC 02/22/2021, 9:41 AM

## 2021-03-02 ENCOUNTER — Telehealth (HOSPITAL_COMMUNITY): Payer: Self-pay

## 2021-03-02 NOTE — Telephone Encounter (Signed)
Discharge follow-up call.  Patient doing well.  No concerns, incision healing well.  Baby remains in NICU.  EPDS 2.

## 2021-03-03 ENCOUNTER — Ambulatory Visit: Payer: Self-pay

## 2021-03-03 NOTE — Lactation Note (Signed)
This note was copied from a baby's chart. Lactation Consultation Note  Patient Name: Renee Mcintyre [id number] Date: 03/03/2021 Reason for consult: Follow-up assessment;Primapara;1st time breastfeeding;NICU baby;Preterm <34wks Age:25 wk.o.  1520 - I followed up with Renee Mcintyre. She was using her DEBP upon entry. She continues to pump strong milk volumes. Her largest volume is during her morning pumping session where she pumps 10-11 ounces after a 4 hour stretch of sleep. She denies questions and concerns about pumping. We reviewed milk storage, how to prevent clogged milk ducts, and maternal diet and breast feeding.    Maternal Data Does the patient have breastfeeding experience prior to this delivery?: No  Feeding Mother's Current Feeding Choice: Breast Milk   Lactation Tools Discussed/Used Tools: Pump Breast pump type: Double-Electric Breast Pump;Other (comment) (Spectra at home) Pump Education: Setup, frequency, and cleaning;Milk Storage Reason for Pumping: NICU; separation Pumping frequency: q3 - 2-3 hours/day; q 4 hours/night Pumped volume: 180 mL (4-10/oz session; 34-40 oz/day)   Consult Status Consult Status: Follow-up Follow-up type: In-patient    Walker Shadow 03/03/2021, 3:43 PM

## 2021-03-11 ENCOUNTER — Ambulatory Visit: Payer: Self-pay

## 2021-03-11 NOTE — Lactation Note (Signed)
This note was copied from a baby's chart. Lactation Consultation Note  Patient Name: Girl Korryn Pancoast [id number] Date: 03/11/2021 Reason for consult: Follow-up assessment;Other (Comment) (phone call) Age:25 wk.o.  I followed up with Ms. Nowotny by phone today. She continues to pump strong volumes using her personal pump. She denies questions or concerns about breast pumping today. I provided our lactation phone number and encouraged her to call as needed.  Feeding Mother's Current Feeding Choice: Breast Milk  Lactation Tools Discussed/Used Breast pump type: Double-Electric Breast Pump;Other (comment) (Personal (spectra)) Pump Education: Setup, frequency, and cleaning Reason for Pumping: NICU; Preterm Pumping frequency: q3 hours Pumped volume: 170 mL (mls/session - 46 oz/daily this week)  Consult Status Consult Status: Follow-up Follow-up type: In-patient    Walker Shadow 03/11/2021, 3:48 PM

## 2021-03-18 ENCOUNTER — Ambulatory Visit: Payer: Self-pay

## 2021-03-18 NOTE — Lactation Note (Signed)
This note was copied from a baby's chart. Lactation Consultation Note LC to room for weekly f/u visit. Mother continues to pump with normal to abundant milk volume. We reviewed IDF and readiness signs. Will plan f/u visit next week. Mother is aware of LC services.   Patient Name: Renee Mcintyre [id number] Date: 03/18/2021 Reason for consult: Follow-up assessment Age:25 years old  Maternal Data  Pumping frequency: per 24-hours  Feeding Mother's Current Feeding Choice: Breast Milk  Interventions Interventions: Education  Consult Status Consult Status: Follow-up Follow-up type: In-patient   Elder Negus, MA IBCLC 03/18/2021, 5:16 PM

## 2021-03-25 ENCOUNTER — Ambulatory Visit: Payer: Self-pay

## 2021-03-25 NOTE — Lactation Note (Signed)
This note was copied from a baby's chart. Lactation Consultation Note Mother continues to pump with sufficient supply and without difficulty. Her plan is to pump and bottle feed only.   Patient Name: Renee Mcintyre Date: 03/25/2021 Reason for consult: Follow-up assessment Age:25 wk.o.  Maternal Data  Pumping frequency: 56 oz per day  Feeding Mother's Current Feeding Choice: Breast Milk   Interventions Interventions: Education   Consult Status Consult Status: Follow-up Follow-up type: In-patient   Elder Negus, MA IBCLC 03/25/2021, 4:45 PM

## 2021-03-30 ENCOUNTER — Ambulatory Visit: Payer: Self-pay

## 2021-03-30 NOTE — Lactation Note (Signed)
This note was copied from a baby's chart. Lactation Consultation Note Mother continues to pump frequently and with abundant milk supply. She continues to pump/bottle feed per her choice.   Patient Name: Renee Mcintyre [id number] Date: 03/30/2021 Reason for consult: Follow-up assessment Age:25 wk.o.  Maternal Data  Pumping frequency: 50+ oz / 24 hours. 7 pumpings  Feeding Mother's Current Feeding Choice: Breast Milk  Interventions Interventions: Education  Consult Status Consult Status: Follow-up Follow-up type: In-patient  Elder Negus, MA IBCLC 03/30/2021, 12:48 PM

## 2021-04-09 ENCOUNTER — Ambulatory Visit: Payer: Self-pay

## 2021-04-09 NOTE — Lactation Note (Signed)
This note was copied from a baby's chart. Lactation Consultation Note  Patient Name: Renee Mcintyre Date: 04/09/2021 Reason for consult: Follow-up assessment;Primapara;1st time breastfeeding;NICU baby;Late-preterm 34-36.6wks Age:25 wk.o.  Visited with mom of 35 6/7 weeks (adjusted) NICU female, she chooses to pump and bottle feed and reports her supply has increased even more, praised her for her efforts. Mom reported pumping about 11-12 oz every morning because she misses her pumping session at night but will pump in the AM until her breasts are fully empty.  Mom voiced pumping sessions are comfortable and she denies any pain/discomfort or engorgement so far.   Plan of care:   Encouraged mom to continue pumping every 2-3 hours, at least 8 pumping sessions/24 hours Parents will start doing some STS with baby every time they come to the unit.   FOB present and very supportive. Parents reported all questions and concerns were answered, they're both aware of NICU LC services and will call PRN  Maternal Data   Mom's milk supply is ANL  Feeding Mother's Current Feeding Choice: Breast Milk  Lactation Tools Discussed/Used Tools: Pump;Flanges Flange Size: 24 Breast pump type: Double-Electric Breast Pump Pump Education: Setup, frequency, and cleaning;Milk Storage Reason for Pumping: LPI in NICU Pumping frequency: 7 times/24 hours Pumped volume: 180 mL (55-58 ounces in 24 hours)  Interventions Interventions: Breast feeding basics reviewed;DEBP;Education  Discharge Pump: DEBP;Personal  Consult Status Consult Status: Follow-up Follow-up type: In-patient   Carrell Rahmani Venetia Constable 04/09/2021, 3:09 PM

## 2021-04-14 ENCOUNTER — Ambulatory Visit: Payer: Self-pay

## 2021-04-14 NOTE — Lactation Note (Signed)
This note was copied from a baby's chart. Lactation Consultation Note  Patient Name: Renee Mcintyre Date: 04/14/2021 Reason for consult: Follow-up assessment;NICU baby Age:25 wk.o.  Lactation followed up with Ms. Scheidegger. She is pumping similar volumes and frequency from our last lactation visit. She reports no changes in her status and no questions or concerns at this time.   Maternal Data Does the patient have breastfeeding experience prior to this delivery?: No  Feeding Mother's Current Feeding Choice: Breast Milk  Lactation Tools Discussed/Used Reason for Pumping: NICU;maternal choice to exclusively pump Pumping frequency: 7 times a day Pumped volume: 240 mL (58-60 oz/day)  Interventions Interventions: Education  Consult Status Consult Status: Follow-up Date: 04/14/21 Follow-up type: In-patient    Walker Shadow 04/14/2021, 3:00 PM

## 2021-04-27 ENCOUNTER — Ambulatory Visit: Payer: Self-pay

## 2021-04-27 NOTE — Lactation Note (Signed)
This note was copied from a baby's chart. Lactation Consultation Note Mother continues to pump frequently and with abundant supply. She asked questions today about becoming a milk donor. Requested information was provided.   Patient Name: Renee Mcintyre [id number] Date: 04/27/2021 Reason for consult: Follow-up assessment Age:25 m.o.  Maternal Data  Pumping frequency: 7 x day - 1680-1950 mL/ day  Feeding Mother's Current Feeding Choice: Breast Milk  Interventions Interventions: Education  Consult Status Consult Status: Follow-up Date: 04/27/21 Follow-up type: In-patient   Elder Negus 04/27/2021, 4:28 PM

## 2021-05-04 ENCOUNTER — Ambulatory Visit: Payer: Self-pay

## 2021-05-04 NOTE — Lactation Note (Signed)
This note was copied from a baby's chart. Lactation Consultation Note Mother continues to pump frequently and with oversupply. We discussed strategies to safely bring milk supply to wnl. Mother is pumping and bottle feeding only per her choice.   Patient Name: Renee Mcintyre [id number] Date: 05/04/2021 Reason for consult: Follow-up assessment Age:25 m.o.  Maternal Data  Pumping frequency: 6-7 x day for 30-35 minutes Pumped volume: 2100 mL  Feeding Nipple Type: Nfant Extra Slow Flow (gold)   Interventions Interventions: Education  Consult Status Consult Status: Follow-up Date: 05/04/21 Follow-up type: In-patient    Elder Negus 05/04/2021, 5:50 PM

## 2021-05-14 ENCOUNTER — Ambulatory Visit: Payer: Self-pay

## 2021-05-14 NOTE — Lactation Note (Addendum)
This note was copied from a baby's chart.  Lactation Consultation Note  Patient Name: Renee Mcintyre [id number] Date: 05/14/2021 Age:25   Subjective Reason for consult: Weekly NICU follow-up Mother reports that she continues to pump frequently and without difficulty. Her plan is to bottle feed only.  Objective Infant data: Mother's Current Feeding Choice: Breast Milk  Maternal data: G1P0101  C-Section, Low Transverse Pumping frequency: 6x/24 hours Pumped volume: 300 mL  Assessment Infant: No data recorded Feeding Status: Ad lib   Maternal: Milk volume: Abundant  Plan: Consult Status: Follow-up  NICU Follow-up type: Weekly NICU follow up Mother to continue current pumping schedule of q4 hours   Elder Negus 05/14/2021, 11:56 AM

## 2021-05-15 ENCOUNTER — Ambulatory Visit: Payer: Self-pay

## 2021-05-15 NOTE — Lactation Note (Signed)
This note was copied from a baby'Renee chart.  Lactation Consultation Note  Patient Name: Renee Mcintyre [name] Date: 05/15/2021 Age:25 m.o.   Subjective Reason for consult: Other (Comment); NICU baby; Primapara; 1st time breastfeeding; Term (baby'Renee d/c)  Mom continues to pump consistently and reports her supply is abundant and steady. Baby is getting discharge today  Objective Infant data: Mother'Renee Current Feeding Choice: Breast Milk  Infant feeding assessment No data recorded No data recorded  Maternal data: G1P0101  C-Section, Low Transverse No data recorded No data recorded  Pumping frequency: 6 times/24 hours  Pumped volume: 300 mL (60 oz./24 hours)  Flange Size: 24  Assessment Infant: No data recorded Feeding Status: Ad lib  Maternal: Milk volume: Abundant  Intervention/Plan Interventions: Pace feeding; DEBP  Mom aware of NICU LC resources and will contact PRN.  Plan: Consult Status: Complete  NICU Follow-up type: Weekly NICU follow up   Renee Mcintyre Renee Mcintyre 05/15/2021, 2:38 PM

## 2021-11-21 ENCOUNTER — Ambulatory Visit
Admission: RE | Admit: 2021-11-21 | Discharge: 2021-11-21 | Disposition: A | Discharge status: Home / Self Care | Payer: BC Managed Care – PPO | Source: Ambulatory Visit

## 2021-11-21 VITALS — BP 135/89 | HR 99 | Temp 98.7°F | Resp 18

## 2021-11-21 DIAGNOSIS — H6982 Other specified disorders of Eustachian tube, left ear: Secondary | ICD-10-CM | POA: Diagnosis not present

## 2021-11-21 MED ORDER — CETIRIZINE HCL 10 MG PO TABS: 10.0000 mg | ORAL_TABLET | Freq: Every day | ORAL | 0 refills | Status: AC

## 2021-11-21 MED ORDER — PSEUDOEPHEDRINE HCL 60 MG PO TABS: 60.0000 mg | ORAL_TABLET | Freq: Three times a day (TID) | ORAL | 0 refills | Status: DC | PRN

## 2021-11-21 MED ORDER — FLUTICASONE PROPIONATE 50 MCG/ACT NA SUSP: 2.0000 | Freq: Every day | NASAL | 12 refills | Status: AC

## 2021-11-21 NOTE — ED Triage Notes (Signed)
Pt report left ear pain x 3 weeks. States she had amoxicillin and cefdinir with some relief.  ? ?Pt is breastfeeding.  ?

## 2021-11-21 NOTE — ED Provider Notes (Signed)
?[facility] ? ? ?MRN: [id number] DOB: 10-31-1995 ? ?Subjective:  ? ?Renee Mcintyre is a 26 y.o. female presenting for 3-week history of persistent intermittent improving left ear pain.  Wants to make sure that she still does not have an infection.  She did just finished a round of 7 days worth amoxicillin followed by 3 days worth of cefdinir.  No fever, ear drainage.  She does have the sensation of ear fullness and popping, clicking.  Patient is breast-feeding. ? ?No current facility-administered medications for this encounter. ? ?Current Outpatient Medications:  ?  acetaminophen (TYLENOL) 500 MG tablet, Take 2 tablets (1,000 mg total) by mouth every 6 (six) hours., Disp: 30 tablet, Rfl: 0 ?  Drospirenone 4 MG TABS, Take by mouth., Disp: , Rfl:  ?  labetalol (NORMODYNE) 300 MG tablet, Take 2 tablets (600 mg total) by mouth every 8 (eight) hours., Disp: 90 tablet, Rfl: 1 ?  NIFEdipine (ADALAT CC) 60 MG 24 hr tablet, Take 1 tablet (60 mg total) by mouth every 12 (twelve) hours., Disp: 60 tablet, Rfl: 1 ?  oxyCODONE (OXY IR/ROXICODONE) 5 MG immediate release tablet, Take 1-2 tablets (5-10 mg total) by mouth every 4 (four) hours as needed for moderate pain., Disp: 10 tablet, Rfl: 0 ?  Prenatal Vit-Fe Fumarate-FA (PRENATAL MULTIVITAMIN) TABS tablet, Take 1 tablet by mouth daily at 12 noon., Disp: , Rfl:   ? ?No Known Allergies ? ?Past Medical History:  ?Diagnosis Date  ? Headache   ?  ? ?Past Surgical History:  ?Procedure Laterality Date  ? CESAREAN SECTION N/A 02/17/2021  ? Procedure: CESAREAN SECTION;  Surgeon: Noland Fordyce, MD;  Location: [facility] LD ORS;  Service: Obstetrics;  Laterality: N/A;  ? ? ?Family History  ?Problem Relation Age of Onset  ? Cancer Mother   ?     Breast cancer  ? Healthy Father   ? ? ?Social History  ? ?Tobacco Use  ? Smoking status: Never  ? Smokeless tobacco: Never  ?Vaping Use  ? Vaping Use: Never used  ?Substance Use Topics  ? Alcohol use: Yes  ?  Comment: occ.   ? Drug use:  No  ? ? ?ROS ? ? ?Objective:  ? ?Vitals: ?BP 135/89 (BP Location: Right Arm)   Pulse 99   Temp 98.7 ?F (37.1 ?C) (Oral)   Resp 18   SpO2 98%   Breastfeeding Yes  ? ?Physical Exam ?Constitutional:   ?   General: She is not in acute distress. ?   Appearance: Normal appearance. She is well-developed. She is not ill-appearing, toxic-appearing or diaphoretic.  ?HENT:  ?   Head: Normocephalic and atraumatic.  ?   Right Ear: Tympanic membrane, ear canal and external ear normal. No tenderness. There is no impacted cerumen. Tympanic membrane is not injected, perforated, erythematous or bulging.  ?   Left Ear: Tympanic membrane, ear canal and external ear normal. No tenderness. There is no impacted cerumen. Tympanic membrane is not injected, perforated, erythematous or bulging.  ?   Nose: Nose normal.  ?   Mouth/Throat:  ?   Mouth: Mucous membranes are moist.  ?Eyes:  ?   General: No scleral icterus.    ?   Right eye: No discharge.     ?   Left eye: No discharge.  ?   Extraocular Movements: Extraocular movements intact.  ?Cardiovascular:  ?   Rate and Rhythm: Normal rate.  ?Pulmonary:  ?   Effort: Pulmonary effort is normal.  ?Skin: ?  General: Skin is warm and dry.  ?Neurological:  ?   General: No focal deficit present.  ?   Mental Status: She is alert and oriented to person, place, and time.  ?Psychiatric:     ?   Mood and Affect: Mood normal.     ?   Behavior: Behavior normal.  ? ? ?Assessment and Plan :  ? ?PDMP not reviewed this encounter. ? ?1. Eustachian tube dysfunction, left   ? ?Unremarkable ENT exam.  Will use conservative management for what I suspect is eustachian tube dysfunction.  Recommended starting Flonase, Zyrtec, Sudafed which is compatible with breast-feeding.  Recommended follow-up with ENT if her symptoms persist.  Will defer further antibiotic use as there are no signs of bacterial infection.  Counseled patient on potential for adverse effects with medications prescribed/recommended today, ER and  return-to-clinic precautions discussed, patient verbalized understanding. ?  ?  ?Wallis Bamberg, PA-C ?11/21/21 1836 ? ?

## 2022-03-23 DIAGNOSIS — H348322 Tributary (branch) retinal vein occlusion, left eye, stable: Secondary | ICD-10-CM | POA: Diagnosis not present

## 2022-04-12 DIAGNOSIS — Z6835 Body mass index (BMI) 35.0-35.9, adult: Secondary | ICD-10-CM | POA: Diagnosis not present

## 2022-04-12 DIAGNOSIS — Z01419 Encounter for gynecological examination (general) (routine) without abnormal findings: Secondary | ICD-10-CM | POA: Diagnosis not present

## 2022-05-17 DIAGNOSIS — Z1283 Encounter for screening for malignant neoplasm of skin: Secondary | ICD-10-CM | POA: Diagnosis not present

## 2022-05-17 DIAGNOSIS — D485 Neoplasm of uncertain behavior of skin: Secondary | ICD-10-CM | POA: Diagnosis not present

## 2022-05-17 DIAGNOSIS — D225 Melanocytic nevi of trunk: Secondary | ICD-10-CM | POA: Diagnosis not present

## 2022-09-05 DIAGNOSIS — H9203 Otalgia, bilateral: Secondary | ICD-10-CM | POA: Diagnosis not present

## 2023-02-16 DIAGNOSIS — Z6841 Body Mass Index (BMI) 40.0 and over, adult: Secondary | ICD-10-CM | POA: Diagnosis not present

## 2023-02-16 DIAGNOSIS — J029 Acute pharyngitis, unspecified: Secondary | ICD-10-CM | POA: Diagnosis not present

## 2023-03-15 DIAGNOSIS — N926 Irregular menstruation, unspecified: Secondary | ICD-10-CM | POA: Diagnosis not present

## 2023-03-15 DIAGNOSIS — Z3689 Encounter for other specified antenatal screening: Secondary | ICD-10-CM | POA: Diagnosis not present

## 2023-03-15 DIAGNOSIS — Z3200 Encounter for pregnancy test, result unknown: Secondary | ICD-10-CM | POA: Diagnosis not present

## 2023-03-20 DIAGNOSIS — N926 Irregular menstruation, unspecified: Secondary | ICD-10-CM | POA: Diagnosis not present

## 2023-03-21 DIAGNOSIS — Z3201 Encounter for pregnancy test, result positive: Secondary | ICD-10-CM | POA: Diagnosis not present

## 2023-04-04 DIAGNOSIS — O200 Threatened abortion: Secondary | ICD-10-CM | POA: Diagnosis not present

## 2023-04-04 DIAGNOSIS — K219 Gastro-esophageal reflux disease without esophagitis: Secondary | ICD-10-CM | POA: Diagnosis not present

## 2023-04-24 DIAGNOSIS — Z124 Encounter for screening for malignant neoplasm of cervix: Secondary | ICD-10-CM | POA: Diagnosis not present

## 2023-04-24 DIAGNOSIS — Z1331 Encounter for screening for depression: Secondary | ICD-10-CM | POA: Diagnosis not present

## 2023-04-24 DIAGNOSIS — O368990 Maternal care for other specified fetal problems, unspecified trimester, not applicable or unspecified: Secondary | ICD-10-CM | POA: Diagnosis not present

## 2023-04-24 DIAGNOSIS — Z8759 Personal history of other complications of pregnancy, childbirth and the puerperium: Secondary | ICD-10-CM | POA: Diagnosis not present

## 2023-04-24 DIAGNOSIS — Z118 Encounter for screening for other infectious and parasitic diseases: Secondary | ICD-10-CM | POA: Diagnosis not present

## 2023-04-24 DIAGNOSIS — Z3689 Encounter for other specified antenatal screening: Secondary | ICD-10-CM | POA: Diagnosis not present

## 2023-04-24 DIAGNOSIS — Z3A10 10 weeks gestation of pregnancy: Secondary | ICD-10-CM | POA: Diagnosis not present

## 2023-04-24 LAB — OB RESULTS CONSOLE GC/CHLAMYDIA
Chlamydia: NEGATIVE
Neisseria Gonorrhea: NEGATIVE

## 2023-04-24 LAB — OB RESULTS CONSOLE RPR: RPR: NONREACTIVE

## 2023-04-24 LAB — OB RESULTS CONSOLE HEPATITIS B SURFACE ANTIGEN: Hepatitis B Surface Ag: NEGATIVE

## 2023-04-24 LAB — OB RESULTS CONSOLE HIV ANTIBODY (ROUTINE TESTING): HIV: NONREACTIVE

## 2023-04-24 LAB — HEPATITIS C ANTIBODY: HCV Ab: NEGATIVE

## 2023-04-24 LAB — OB RESULTS CONSOLE RUBELLA ANTIBODY, IGM: Rubella: NON-IMMUNE/NOT IMMUNE

## 2023-05-29 DIAGNOSIS — Z23 Encounter for immunization: Secondary | ICD-10-CM | POA: Diagnosis not present

## 2023-05-29 DIAGNOSIS — Z361 Encounter for antenatal screening for raised alphafetoprotein level: Secondary | ICD-10-CM | POA: Diagnosis not present

## 2023-05-29 DIAGNOSIS — Z8759 Personal history of other complications of pregnancy, childbirth and the puerperium: Secondary | ICD-10-CM | POA: Diagnosis not present

## 2023-06-19 DIAGNOSIS — Z363 Encounter for antenatal screening for malformations: Secondary | ICD-10-CM | POA: Diagnosis not present

## 2023-06-26 IMAGING — US US MFM FETAL BPP W/O NON-STRESS
1 series · 15 of 28 positions shown · non-contrast
Comparison: none

[Series 1: us mfm fetal bpp w/o non-stress · 28 acquisitions, 15 frames shown]
[im 1/28]
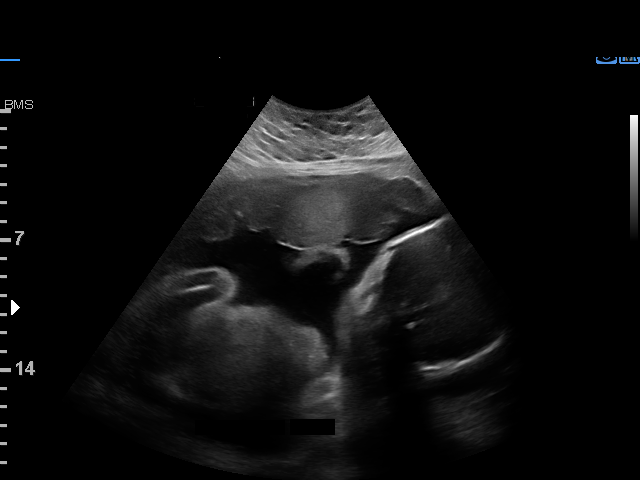
[im 3/28]
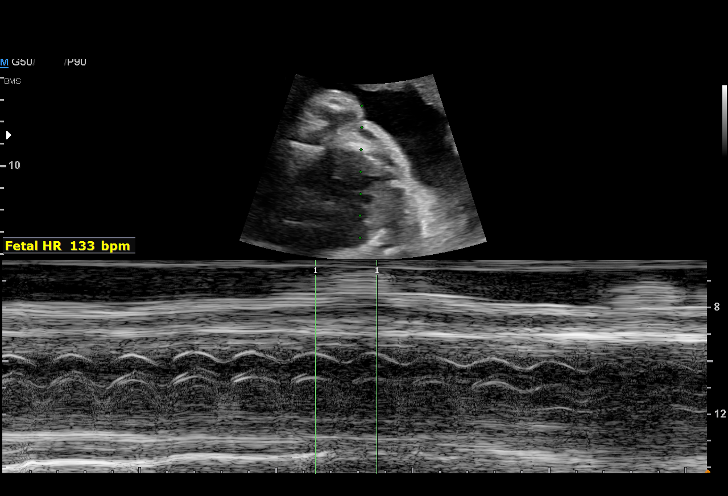
[im 5/28]
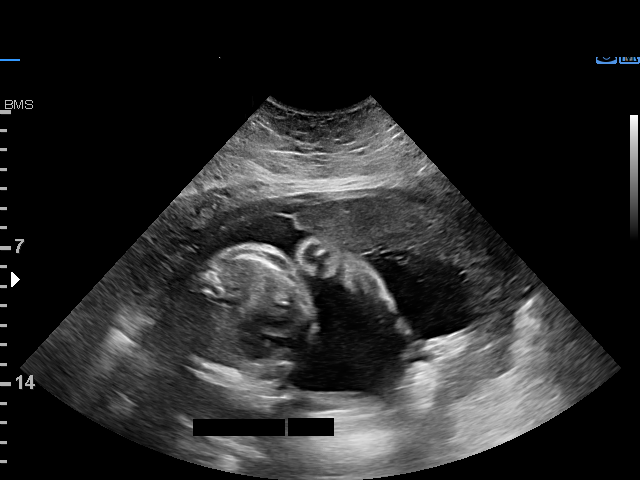
[im 7/28]
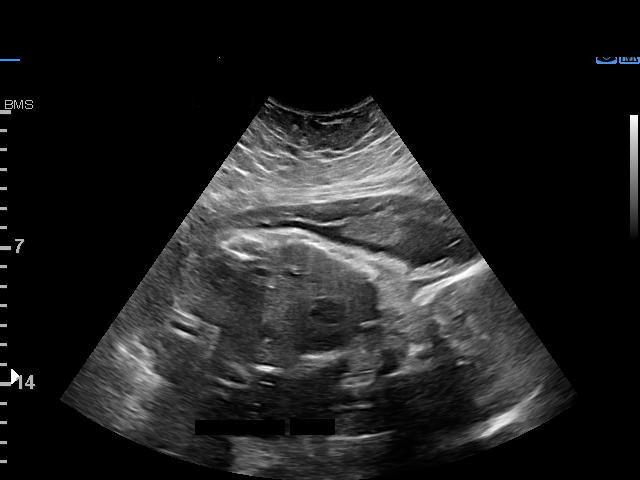
[im 9/28]
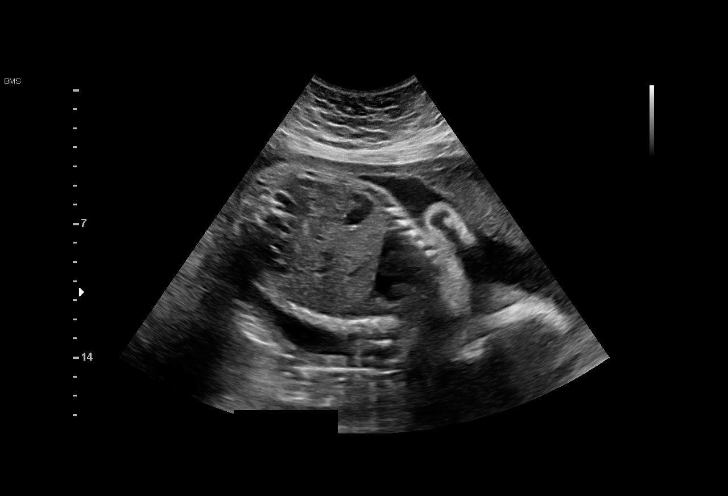
[im 11/28]
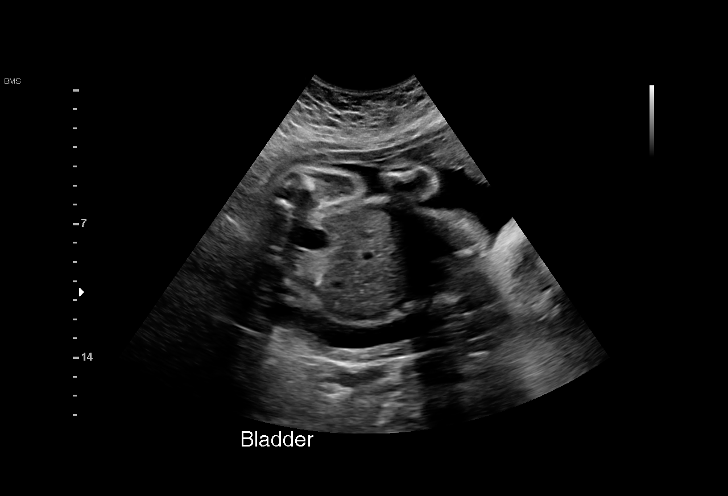
[im 13/28]
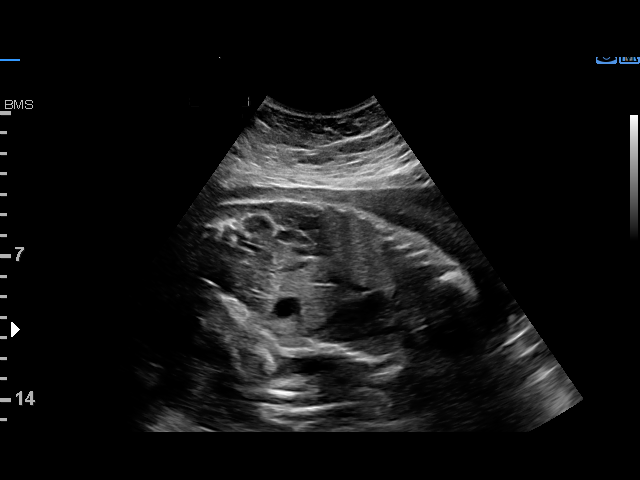
[im 15/28]
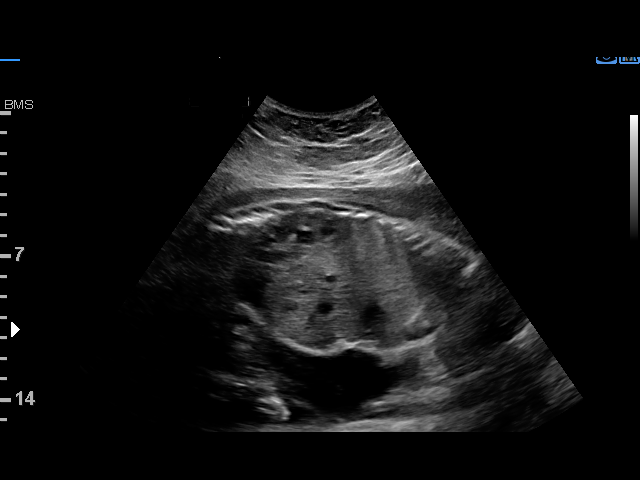
[im 16/28]
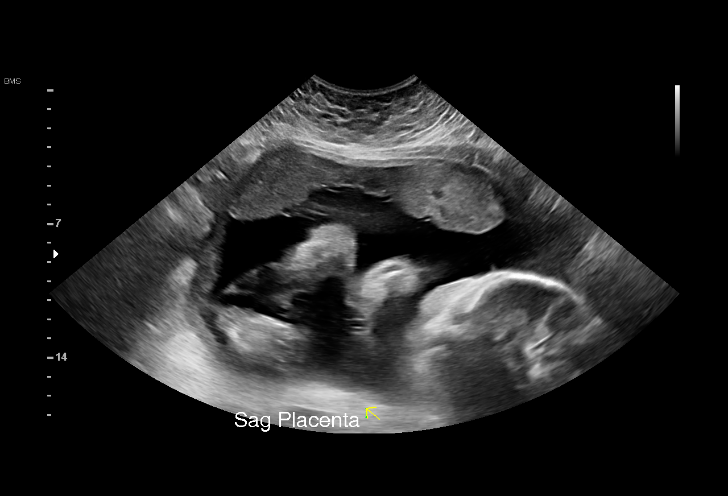
[im 18/28]
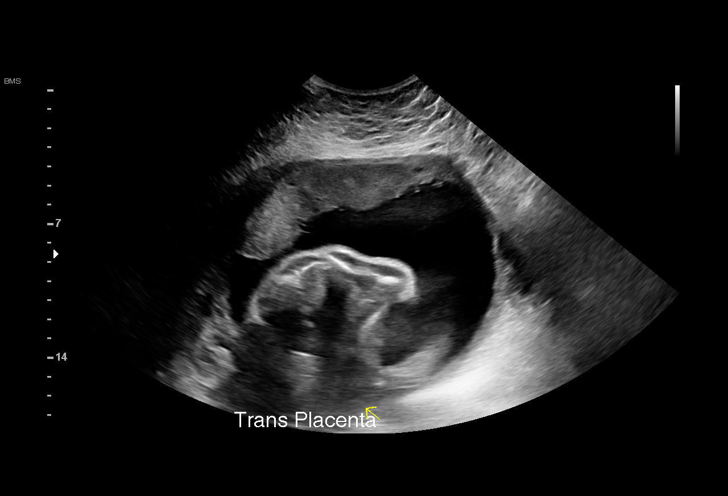
[im 20/28]
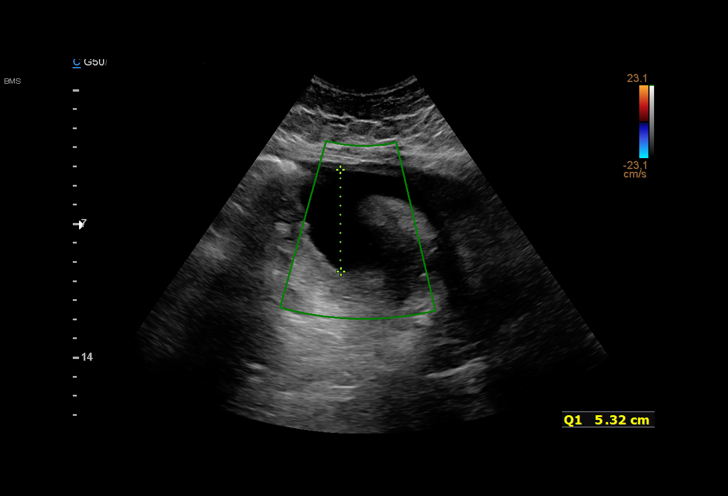
[im 22/28]
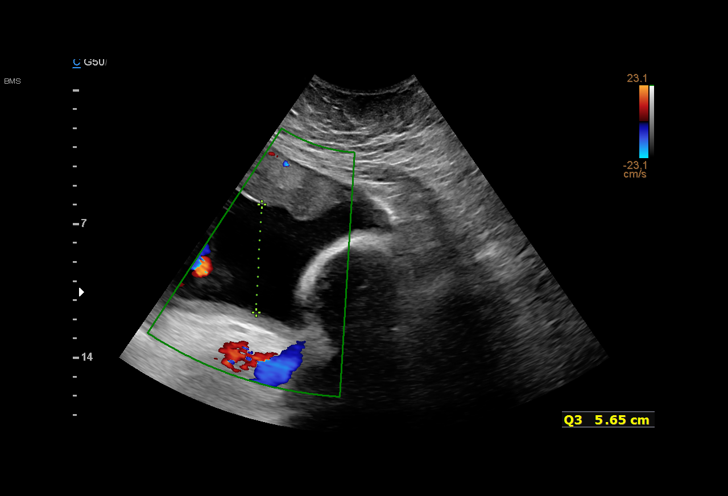
[im 24/28]
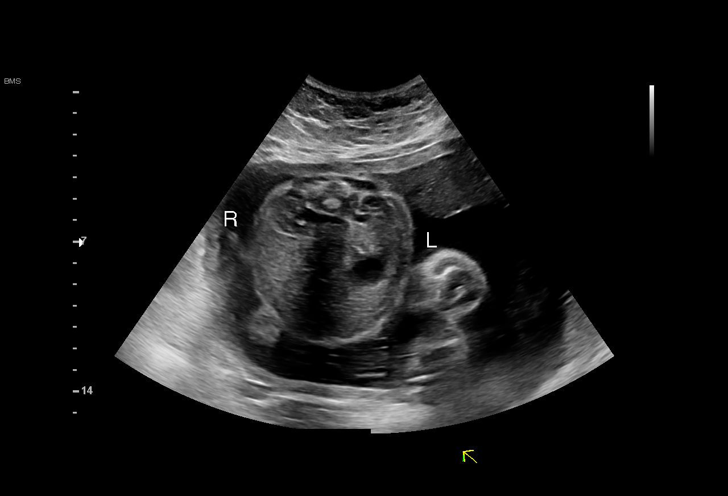
[im 26/28]
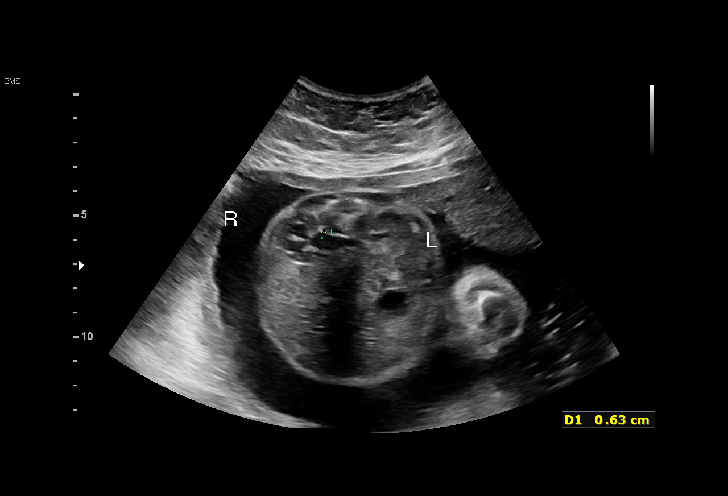
[im 28/28]
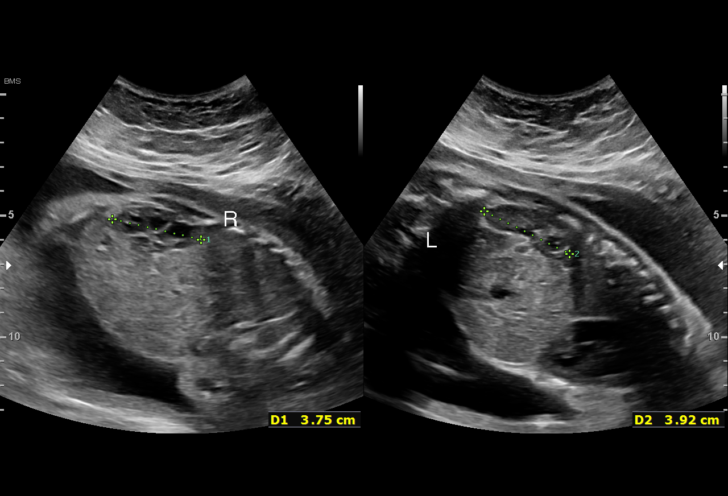

[15 of 28 positions shown; findings below may reference images not displayed]

0502 [HOSPITAL]
                   LAW DO

Indications

 Severe preeclampsia, third trimester
 28 weeks gestation of pregnancy
 Obesity complicating pregnancy, third
 trimester (BMI:43)
 Low risk panorama, AFP:Neg,
Fetal Evaluation

 Num Of Fetuses:         1
 Fetal Heart Rate(bpm):  133
 Cardiac Activity:       Observed
 Presentation:           Cephalic
 Placenta:               Anterior

 Amniotic Fluid
 AFI FV:      Within normal limits

 AFI Sum(cm)     %Tile       Largest Pocket(cm)
 22.23           91

 RUQ(cm)       RLQ(cm)       LUQ(cm)        LLQ(cm)

Biophysical Evaluation

 Amniotic F.V:   Pocket => 2 cm             F. Tone:        Observed
 F. Movement:    Observed                   Score:          [DATE]
 F. Breathing:   Observed
OB History
 Gravidity:    1
Gestational Age

 LMP:           28w 2d        Date:  08/01/20                 EDD:   05/08/21
 Best:          28w 2d     Det. By:  LMP  (08/01/20)          EDD:   05/08/21
Anatomy

 Stomach:               Appears normal, left   Bladder:                Appears normal
                        sided
 Kidneys:               Right UTD - see
                        comment

 Other:  LT kidney appears Normal. Rt kidney with previously seen UTD.
         Technically difficult due to maternal habitus and fetal position and
         movement.
Cervix Uterus Adnexa

 Cervix
 Not visualized (advanced GA >11wks)

 Uterus
 No abnormality visualized.
Comments

 This patient has been hospitalized due to severe
 preeclampsia.
 A biophysical profile performed today was [DATE].
 There was normal amniotic fluid noted on today's ultrasound
 exam.
 Right pyelectasis continues to be noted on today's exam.

## 2023-07-05 DIAGNOSIS — Z362 Encounter for other antenatal screening follow-up: Secondary | ICD-10-CM | POA: Diagnosis not present

## 2023-08-10 DIAGNOSIS — J209 Acute bronchitis, unspecified: Secondary | ICD-10-CM | POA: Diagnosis not present

## 2023-08-22 DIAGNOSIS — Z3689 Encounter for other specified antenatal screening: Secondary | ICD-10-CM | POA: Diagnosis not present

## 2023-08-22 DIAGNOSIS — Z0183 Encounter for blood typing: Secondary | ICD-10-CM | POA: Diagnosis not present

## 2023-08-22 DIAGNOSIS — Z23 Encounter for immunization: Secondary | ICD-10-CM | POA: Diagnosis not present

## 2023-08-22 LAB — OB RESULTS CONSOLE ANTIBODY SCREEN: Antibody Screen: NEGATIVE

## 2023-09-25 DIAGNOSIS — O99213 Obesity complicating pregnancy, third trimester: Secondary | ICD-10-CM | POA: Diagnosis not present

## 2023-09-25 DIAGNOSIS — Z3A32 32 weeks gestation of pregnancy: Secondary | ICD-10-CM | POA: Diagnosis not present

## 2023-09-27 ENCOUNTER — Other Ambulatory Visit: Payer: Self-pay | Admitting: Obstetrics and Gynecology

## 2023-10-05 DIAGNOSIS — Z3A34 34 weeks gestation of pregnancy: Secondary | ICD-10-CM | POA: Diagnosis not present

## 2023-10-05 DIAGNOSIS — O99213 Obesity complicating pregnancy, third trimester: Secondary | ICD-10-CM | POA: Diagnosis not present

## 2023-10-12 DIAGNOSIS — Z8759 Personal history of other complications of pregnancy, childbirth and the puerperium: Secondary | ICD-10-CM | POA: Diagnosis not present

## 2023-10-12 DIAGNOSIS — Z3A35 35 weeks gestation of pregnancy: Secondary | ICD-10-CM | POA: Diagnosis not present

## 2023-10-19 DIAGNOSIS — Z3A36 36 weeks gestation of pregnancy: Secondary | ICD-10-CM | POA: Diagnosis not present

## 2023-10-19 DIAGNOSIS — O99213 Obesity complicating pregnancy, third trimester: Secondary | ICD-10-CM | POA: Diagnosis not present

## 2023-10-19 DIAGNOSIS — Z3685 Encounter for antenatal screening for Streptococcus B: Secondary | ICD-10-CM | POA: Diagnosis not present

## 2023-10-24 ENCOUNTER — Encounter (HOSPITAL_COMMUNITY): Payer: Self-pay

## 2023-10-24 ENCOUNTER — Telehealth (HOSPITAL_COMMUNITY): Payer: Self-pay | Admitting: *Deleted

## 2023-10-24 NOTE — Patient Instructions (Addendum)
"   Renee Mcintyre  10/24/2023   Your procedure is scheduled on:  11/07/2023  Arrive at 1130 at Entrance C on [address] [facility]  and [address]. You are invited to use the FREE valet parking or use the Visitor's parking deck.  Pick up the phone at the desk and dial [phone].  Call this number if you have problems the morning of surgery: [phone]  Remember:   Do not eat food:(After Midnight) Desps de medianoche.  You may drink clear liquids until arrival at ___1130__.  Clear liquids means a liquid you can see thru.  It can have color such as Cola or Kool aid.  Tea is OK and coffee as long as no milk or creamer of any kind.  Take these medicines the morning of surgery with A SIP OF WATER :  none   Do not wear jewelry, make-up or nail polish.  Do not wear lotions, powders, or perfumes. Do not wear deodorant.  Do not shave 48 hours prior to surgery.  Do not bring valuables to the hospital.  [facility] is not   responsible for any belongings or valuables brought to the hospital.  Contacts, dentures or bridgework may not be worn into surgery.  Leave suitcase in the car. After surgery it may be brought to your room.  For patients admitted to the hospital, checkout time is 11:00 AM the day of              discharge.      Please read over the following fact sheets that you were given:     Preparing for Surgery   "

## 2023-10-24 NOTE — Telephone Encounter (Signed)
 Preadmission screen

## 2023-10-25 ENCOUNTER — Encounter (HOSPITAL_COMMUNITY): Payer: Self-pay

## 2023-10-25 DIAGNOSIS — O99213 Obesity complicating pregnancy, third trimester: Secondary | ICD-10-CM | POA: Diagnosis not present

## 2023-10-25 DIAGNOSIS — Z3A37 37 weeks gestation of pregnancy: Secondary | ICD-10-CM | POA: Diagnosis not present

## 2023-10-30 ENCOUNTER — Inpatient Hospital Stay (HOSPITAL_COMMUNITY): Admitting: Anesthesiology

## 2023-10-30 ENCOUNTER — Encounter (HOSPITAL_COMMUNITY): Payer: Self-pay | Admitting: Obstetrics & Gynecology

## 2023-10-30 ENCOUNTER — Inpatient Hospital Stay (HOSPITAL_COMMUNITY)
Admission: AD | Admit: 2023-10-30 | Discharge: 2023-11-01 | DRG: 787 | Disposition: A | Discharge status: Home / Self Care | Attending: Obstetrics & Gynecology | Admitting: Obstetrics & Gynecology

## 2023-10-30 ENCOUNTER — Other Ambulatory Visit: Payer: Self-pay

## 2023-10-30 ENCOUNTER — Encounter (HOSPITAL_COMMUNITY)
Admission: AD | Disposition: A | Discharge status: Home / Self Care | Payer: Self-pay | Source: Home / Self Care | Attending: Obstetrics & Gynecology

## 2023-10-30 ENCOUNTER — Inpatient Hospital Stay (HOSPITAL_COMMUNITY)
Admission: RE | Admit: 2023-10-30 | Payer: BC Managed Care – PPO | Source: Home / Self Care | Admitting: Obstetrics and Gynecology

## 2023-10-30 DIAGNOSIS — Z6791 Unspecified blood type, Rh negative: Secondary | ICD-10-CM

## 2023-10-30 DIAGNOSIS — Z412 Encounter for routine and ritual male circumcision: Secondary | ICD-10-CM | POA: Diagnosis not present

## 2023-10-30 DIAGNOSIS — Z3A37 37 weeks gestation of pregnancy: Secondary | ICD-10-CM

## 2023-10-30 DIAGNOSIS — D62 Acute posthemorrhagic anemia: Secondary | ICD-10-CM | POA: Diagnosis not present

## 2023-10-30 DIAGNOSIS — E66813 Obesity, class 3: Secondary | ICD-10-CM | POA: Diagnosis present

## 2023-10-30 DIAGNOSIS — O34219 Maternal care for unspecified type scar from previous cesarean delivery: Secondary | ICD-10-CM | POA: Diagnosis not present

## 2023-10-30 DIAGNOSIS — O99824 Streptococcus B carrier state complicating childbirth: Secondary | ICD-10-CM | POA: Diagnosis present

## 2023-10-30 DIAGNOSIS — O9081 Anemia of the puerperium: Secondary | ICD-10-CM | POA: Diagnosis not present

## 2023-10-30 DIAGNOSIS — Z3A38 38 weeks gestation of pregnancy: Secondary | ICD-10-CM | POA: Diagnosis not present

## 2023-10-30 DIAGNOSIS — O34211 Maternal care for low transverse scar from previous cesarean delivery: Secondary | ICD-10-CM | POA: Diagnosis present

## 2023-10-30 DIAGNOSIS — O99214 Obesity complicating childbirth: Secondary | ICD-10-CM | POA: Diagnosis not present

## 2023-10-30 DIAGNOSIS — O1404 Mild to moderate pre-eclampsia, complicating childbirth: Secondary | ICD-10-CM | POA: Diagnosis not present

## 2023-10-30 DIAGNOSIS — Z23 Encounter for immunization: Secondary | ICD-10-CM | POA: Diagnosis not present

## 2023-10-30 DIAGNOSIS — O26893 Other specified pregnancy related conditions, third trimester: Secondary | ICD-10-CM | POA: Diagnosis present

## 2023-10-30 DIAGNOSIS — Z3A00 Weeks of gestation of pregnancy not specified: Secondary | ICD-10-CM | POA: Diagnosis not present

## 2023-10-30 DIAGNOSIS — Z98891 History of uterine scar from previous surgery: Principal | ICD-10-CM

## 2023-10-30 HISTORY — DX: Gestational (pregnancy-induced) hypertension without significant proteinuria, unspecified trimester: O13.9

## 2023-10-30 LAB — CBC
HCT: 34.5 % — ABNORMAL LOW (ref 36.0–46.0)
Hemoglobin: 11.6 g/dL — ABNORMAL LOW (ref 12.0–15.0)
MCH: 29.9 pg (ref 26.0–34.0)
MCHC: 33.6 g/dL (ref 30.0–36.0)
MCV: 88.9 fL (ref 80.0–100.0)
Platelets: 201 10*3/uL (ref 150–400)
RBC: 3.88 MIL/uL (ref 3.87–5.11)
RDW: 14.6 % (ref 11.5–15.5)
WBC: 11.3 10*3/uL — ABNORMAL HIGH (ref 4.0–10.5)
nRBC: 0 % (ref 0.0–0.2)

## 2023-10-30 LAB — TYPE AND SCREEN
ABO/RH(D): O NEG
Antibody Screen: NEGATIVE

## 2023-10-30 LAB — COMPREHENSIVE METABOLIC PANEL
ALT: 16 U/L (ref 0–44)
AST: 21 U/L (ref 15–41)
Albumin: 2.6 g/dL — ABNORMAL LOW (ref 3.5–5.0)
Alkaline Phosphatase: 148 U/L — ABNORMAL HIGH (ref 38–126)
Anion gap: 9 (ref 5–15)
BUN: 6 mg/dL (ref 6–20)
CO2: 19 mmol/L — ABNORMAL LOW (ref 22–32)
Calcium: 8.5 mg/dL — ABNORMAL LOW (ref 8.9–10.3)
Chloride: 108 mmol/L (ref 98–111)
Creatinine, Ser: 0.67 mg/dL (ref 0.44–1.00)
GFR, Estimated: >60 mL/min (ref >60)
Glucose, Bld: 96 mg/dL (ref 70–99)
Potassium: 3.7 mmol/L (ref 3.5–5.1)
Sodium: 136 mmol/L (ref 135–145)
Total Bilirubin: 0.4 mg/dL (ref 0.0–1.2)
Total Protein: 6.1 g/dL — ABNORMAL LOW (ref 6.5–8.1)

## 2023-10-30 LAB — PROTEIN / CREATININE RATIO, URINE
Creatinine, Urine: 151 mg/dL
Protein Creatinine Ratio: 0.44 mg/mg{creat} — ABNORMAL HIGH (ref 0.00–0.15)
Total Protein, Urine: 66 mg/dL

## 2023-10-30 LAB — RPR: RPR Ser Ql: NONREACTIVE

## 2023-10-30 SURGERY — Surgical Case
Anesthesia: Spinal | Site: Abdomen

## 2023-10-30 MED ORDER — DEXAMETHASONE SODIUM PHOSPHATE 4 MG/ML IJ SOLN
INTRAMUSCULAR | Status: AC
  Filled 2023-10-30: qty 2

## 2023-10-30 MED ORDER — MORPHINE SULFATE (PF) 0.5 MG/ML IJ SOLN
INTRAMUSCULAR | Status: DC | PRN
  Administered 2023-10-30: 150 ug via INTRATHECAL

## 2023-10-30 MED ORDER — METOCLOPRAMIDE HCL 5 MG/ML IJ SOLN
INTRAMUSCULAR | Status: AC
  Filled 2023-10-30: qty 2

## 2023-10-30 MED ORDER — CEFAZOLIN SODIUM-DEXTROSE 2-4 GM/100ML-% IV SOLN
2.0000 g | INTRAVENOUS | Status: AC
  Administered 2023-10-30: 3 g via INTRAVENOUS
  Filled 2023-10-30: qty 100

## 2023-10-30 MED ORDER — OXYTOCIN-SODIUM CHLORIDE 30-0.9 UT/500ML-% IV SOLN
2.5000 [IU]/h | INTRAVENOUS | Status: AC
  Administered 2023-10-30: 2.5 [IU]/h via INTRAVENOUS
  Filled 2023-10-30: qty 500

## 2023-10-30 MED ORDER — FENTANYL CITRATE (PF) 100 MCG/2ML IJ SOLN
INTRAMUSCULAR | Status: AC
  Filled 2023-10-30: qty 2

## 2023-10-30 MED ORDER — OXYCODONE HCL 5 MG PO TABS: 5.0000 mg | ORAL_TABLET | ORAL | Status: DC | PRN

## 2023-10-30 MED ORDER — DIBUCAINE (PERIANAL) 1 % EX OINT: 1.0000 | TOPICAL_OINTMENT | CUTANEOUS | Status: DC | PRN

## 2023-10-30 MED ORDER — ONDANSETRON HCL 4 MG/2ML IJ SOLN
INTRAMUSCULAR | Status: DC | PRN
  Administered 2023-10-30: 4 mg via INTRAVENOUS

## 2023-10-30 MED ORDER — SENNOSIDES-DOCUSATE SODIUM 8.6-50 MG PO TABS
2.0000 | ORAL_TABLET | Freq: Every day | ORAL | Status: DC
  Administered 2023-10-31: 2 via ORAL
  Filled 2023-10-30 (×2): qty 2

## 2023-10-30 MED ORDER — COCONUT OIL OIL: 1.0000 | TOPICAL_OIL | Status: DC | PRN

## 2023-10-30 MED ORDER — MORPHINE SULFATE (PF) 0.5 MG/ML IJ SOLN
INTRAMUSCULAR | Status: AC
  Filled 2023-10-30: qty 10

## 2023-10-30 MED ORDER — IBUPROFEN 600 MG PO TABS
600.0000 mg | ORAL_TABLET | Freq: Four times a day (QID) | ORAL | Status: DC
  Administered 2023-10-31 – 2023-11-01 (×3): 600 mg via ORAL
  Filled 2023-10-30 (×3): qty 1

## 2023-10-30 MED ORDER — MISOPROSTOL 200 MCG PO TABS
800.0000 ug | ORAL_TABLET | Freq: Once | ORAL | Status: AC
  Administered 2023-10-30: 800 ug via RECTAL
  Filled 2023-10-30: qty 4

## 2023-10-30 MED ORDER — PRENATAL MULTIVITAMIN CH
1.0000 | ORAL_TABLET | Freq: Every day | ORAL | Status: DC
  Administered 2023-10-31 – 2023-11-01 (×2): 1 via ORAL
  Filled 2023-10-30 (×2): qty 1

## 2023-10-30 MED ORDER — ACETAMINOPHEN 10 MG/ML IV SOLN
INTRAVENOUS | Status: AC
  Filled 2023-10-30: qty 100

## 2023-10-30 MED ORDER — STERILE WATER FOR IRRIGATION IR SOLN
Status: DC | PRN
  Administered 2023-10-30: 1000 mL

## 2023-10-30 MED ORDER — PHENYLEPHRINE HCL-NACL 20-0.9 MG/250ML-% IV SOLN
INTRAVENOUS | Status: DC | PRN
  Administered 2023-10-30: 60 ug/min via INTRAVENOUS

## 2023-10-30 MED ORDER — OXYTOCIN-SODIUM CHLORIDE 30-0.9 UT/500ML-% IV SOLN
INTRAVENOUS | Status: DC | PRN
Start: 2023-10-30 — End: 2023-10-30
  Administered 2023-10-30: 300 mL via INTRAVENOUS

## 2023-10-30 MED ORDER — ACETAMINOPHEN 500 MG PO TABS
1000.0000 mg | ORAL_TABLET | Freq: Four times a day (QID) | ORAL | Status: DC
  Administered 2023-10-30 – 2023-11-01 (×7): 1000 mg via ORAL
  Filled 2023-10-30 (×7): qty 2

## 2023-10-30 MED ORDER — DEXAMETHASONE SODIUM PHOSPHATE 10 MG/ML IJ SOLN
INTRAMUSCULAR | Status: DC | PRN
  Administered 2023-10-30: 10 mg via INTRAVENOUS

## 2023-10-30 MED ORDER — POVIDONE-IODINE 10 % EX SWAB
2.0000 | Freq: Once | CUTANEOUS | Status: AC
Start: 2023-10-30 — End: 2023-10-30
  Administered 2023-10-30: 2 via TOPICAL

## 2023-10-30 MED ORDER — DIPHENHYDRAMINE HCL 25 MG PO CAPS: 25.0000 mg | ORAL_CAPSULE | Freq: Four times a day (QID) | ORAL | Status: DC | PRN

## 2023-10-30 MED ORDER — PHENYLEPHRINE HCL-NACL 20-0.9 MG/250ML-% IV SOLN
INTRAVENOUS | Status: AC
  Filled 2023-10-30: qty 250

## 2023-10-30 MED ORDER — SOD CITRATE-CITRIC ACID 500-334 MG/5ML PO SOLN
30.0000 mL | Freq: Once | ORAL | Status: AC
  Administered 2023-10-30: 30 mL via ORAL
  Filled 2023-10-30: qty 30

## 2023-10-30 MED ORDER — OXYTOCIN-SODIUM CHLORIDE 30-0.9 UT/500ML-% IV SOLN
INTRAVENOUS | Status: AC
  Filled 2023-10-30: qty 500

## 2023-10-30 MED ORDER — ONDANSETRON HCL 4 MG/2ML IJ SOLN
INTRAMUSCULAR | Status: AC
  Filled 2023-10-30: qty 2

## 2023-10-30 MED ORDER — KETOROLAC TROMETHAMINE 30 MG/ML IJ SOLN
30.0000 mg | Freq: Four times a day (QID) | INTRAMUSCULAR | Status: AC
  Administered 2023-10-30 – 2023-10-31 (×4): 30 mg via INTRAVENOUS
  Filled 2023-10-30 (×4): qty 1

## 2023-10-30 MED ORDER — WITCH HAZEL-GLYCERIN EX PADS: 1.0000 | MEDICATED_PAD | CUTANEOUS | Status: DC | PRN

## 2023-10-30 MED ORDER — PHENYLEPHRINE 80 MCG/ML (10ML) SYRINGE FOR IV PUSH (FOR BLOOD PRESSURE SUPPORT)
PREFILLED_SYRINGE | INTRAVENOUS | Status: AC
  Filled 2023-10-30: qty 10

## 2023-10-30 MED ORDER — LACTATED RINGERS IV SOLN: INTRAVENOUS | Status: DC

## 2023-10-30 MED ORDER — SIMETHICONE 80 MG PO CHEW
80.0000 mg | CHEWABLE_TABLET | Freq: Three times a day (TID) | ORAL | Status: DC
  Administered 2023-10-30 – 2023-11-01 (×6): 80 mg via ORAL
  Filled 2023-10-30 (×6): qty 1

## 2023-10-30 MED ORDER — BUPIVACAINE IN DEXTROSE 0.75-8.25 % IT SOLN
INTRATHECAL | Status: DC | PRN
  Administered 2023-10-30: 1.8 mL via INTRATHECAL

## 2023-10-30 MED ORDER — SODIUM CHLORIDE 0.9 % IR SOLN
Status: DC | PRN
  Administered 2023-10-30: 1000 mL

## 2023-10-30 MED ORDER — ACETAMINOPHEN 10 MG/ML IV SOLN
INTRAVENOUS | Status: DC | PRN
  Administered 2023-10-30: 1000 mg via INTRAVENOUS

## 2023-10-30 MED ORDER — MENTHOL 3 MG MT LOZG: 1.0000 | LOZENGE | OROMUCOSAL | Status: DC | PRN

## 2023-10-30 MED ORDER — SIMETHICONE 80 MG PO CHEW: 80.0000 mg | CHEWABLE_TABLET | ORAL | Status: DC | PRN

## 2023-10-30 MED ORDER — FENTANYL CITRATE (PF) 100 MCG/2ML IJ SOLN
INTRAMUSCULAR | Status: DC | PRN
  Administered 2023-10-30: 15 ug via INTRATHECAL

## 2023-10-30 SURGICAL SUPPLY — 33 items
BENZOIN TINCTURE PRP APPL 2/3 (GAUZE/BANDAGES/DRESSINGS) ×2 IMPLANT
CANISTER WOUND CARE 500ML ATS (WOUND CARE) IMPLANT
CHLORAPREP W/TINT 26 (MISCELLANEOUS) ×4 IMPLANT
CLAMP UMBILICAL CORD (MISCELLANEOUS) ×2 IMPLANT
CLOTH BEACON ORANGE TIMEOUT ST (SAFETY) ×1 IMPLANT
DRESSING PREVENA PLUS CUSTOM (GAUZE/BANDAGES/DRESSINGS) IMPLANT
DRSG OPSITE POSTOP 4X10 (GAUZE/BANDAGES/DRESSINGS) ×2 IMPLANT
DRSG PREVENA PLUS CUSTOM (GAUZE/BANDAGES/DRESSINGS) ×1 IMPLANT
ELECT REM PT RETURN 9FT ADLT (ELECTROSURGICAL) ×1 IMPLANT
ELECTRODE REM PT RTRN 9FT ADLT (ELECTROSURGICAL) ×2 IMPLANT
EXTRACTOR VACUUM KIWI (MISCELLANEOUS) ×1 IMPLANT
GLOVE BIO SURGEON STRL SZ 6 (GLOVE) ×1 IMPLANT
GLOVE BIOGEL PI IND STRL 6.5 (GLOVE) ×1 IMPLANT
GOWN STRL REUS W/TWL LRG LVL3 (GOWN DISPOSABLE) ×2 IMPLANT
KIT ABG SYR 3ML LUER SLIP (SYRINGE) IMPLANT
NDL HYPO 25X5/8 SAFETYGLIDE (NEEDLE) IMPLANT
NEEDLE HYPO 25X5/8 SAFETYGLIDE (NEEDLE) IMPLANT
NS IRRIG 1000ML POUR BTL (IV SOLUTION) ×2 IMPLANT
PACK C SECTION WH (CUSTOM PROCEDURE TRAY) ×2 IMPLANT
PAD OB MATERNITY 4.3X12.25 (PERSONAL CARE ITEMS) ×1 IMPLANT
RETRACTOR WND ALEXIS 25 LRG (MISCELLANEOUS) ×1 IMPLANT
RTRCTR WOUND ALEXIS 25CM LRG (MISCELLANEOUS) ×1 IMPLANT
STRIP CLOSURE SKIN 1/2X4 (GAUZE/BANDAGES/DRESSINGS) IMPLANT
SUT MNCRL 0 VIOLET CTX 36 (SUTURE) ×2 IMPLANT
SUT PLAIN 2 0 XLH (SUTURE) IMPLANT
SUT VIC AB 0 CT1 36 (SUTURE) ×2 IMPLANT
SUT VIC AB 2-0 CT1 TAPERPNT 27 (SUTURE) ×2 IMPLANT
SUT VIC AB 3-0 CT1 TAPERPNT 27 (SUTURE) IMPLANT
SUT VIC AB 4-0 PS2 27 (SUTURE) ×1 IMPLANT
SUTURE PLAIN GUT 2.0 ETHICON (SUTURE) IMPLANT
TOWEL OR 17X24 6PK STRL BLUE (TOWEL DISPOSABLE) ×2 IMPLANT
TRAY FOLEY W/BAG SLVR 14FR LF (SET/KITS/TRAYS/PACK) IMPLANT
WATER STERILE IRR 1000ML POUR (IV SOLUTION) ×1 IMPLANT

## 2023-10-30 NOTE — H&P (Signed)
 Roneisha Stern is a 28 y.o. female presenting for urgent repeat C/section at 37.6 wks for elevated BP/PIH with prior h/o PEC in 1st pregnancy  Pt declined TOLAC G2P0101 with LTCS at 28 wks for severe PEC  [facility] gyn/ Dr Conni Elliot following, saw pt today with new onset high BP Maternal obesity BMI >40 H/o severe preterm PEC, on bbASA, growth at 32.6 wks - 5'3" 80% AC 95% AFI nl Wkly NST from 36 wks reactive  Rubella Non immune  NIPT low risk  OB History     Gravida  2   Para  1   Term      Preterm  1   AB      Living  1      SAB      IAB      Ectopic      Multiple  0   Live Births  1          Past Medical History:  Diagnosis Date   Headache    Pregnancy induced hypertension    Past Surgical History:  Procedure Laterality Date   CESAREAN SECTION N/A 02/17/2021   Procedure: CESAREAN SECTION;  Surgeon: Noland Fordyce, MD;  Location: [facility] LD ORS;  Service: Obstetrics;  Laterality: N/A;   Family History: family history includes Cancer in her mother; Healthy in her father. Social History:  reports that she has never smoked. She has never used smokeless tobacco. She reports that she does not currently use alcohol. She reports that she does not use drugs.     Maternal Diabetes: No Genetic Screening: Normal Maternal Ultrasounds/Referrals: Normal Fetal Ultrasounds or other Referrals:  None Maternal Substance Abuse:  No Significant Maternal Medications:  Meds include: Other: bbASA Significant Maternal Lab Results:  GBS +.  Rh neg  Number of Prenatal Visits:greater than 3 verified prenatal visits Maternal Vaccinations:TDap Other Comments:  None  Review of Systems History   Blood pressure (!) 155/93, pulse 98, temperature 98.4 F (36.9 C), temperature source Oral, resp. rate 18, height 5\' 5"  (1.651 m), weight 124.1 kg, SpO2 97%, currently breastfeeding. Exam Physical Exam  Prenatal labs: ABO, Rh:  O(-)  Antibody: Negative (01/08  0000) Rubella: Nonimmune (09/10 0000) RPR: Nonreactive (09/10 0000)  HBsAg: Negative (09/10 0000)  Hep C neg HIV: Non-reactive (09/10 0000)  GBS:   positive  AFP1 neg NIPT low risk   Assessment/Plan: 28 yo G2P0101 at 37.6 wks with elevated BP with prior h/o severe preterm PEC and one C/section, desires repeat C/section Here at 37.6 wks for maternal GHTN vs PEC, labs from MAU NPO, get ready for RC/s. No TL.  OR informed and will wait our turn if MAU BPs are get stable, FHT cat I and labs normal   Risks/complications of surgery reviewed incl infection, bleeding, damage to internal organs including bladder, bowels, ureters, blood vessels, other risks from anesthesia, VTE and delayed complications of any surgery, complications in future surgery reviewed. Also discussed neonatal complications incl difficult delivery, laceration, vacuum assistance, TTN etc. Pt understands and agrees, all concerns addressed.      Robley Fries 10/30/2023, 12:10 PM

## 2023-10-30 NOTE — Op Note (Signed)
 Cesarean Section Procedure Note   Renee Mcintyre  10/30/2023  Indications:  h/o c/s, desires repeat; mild pre-eclampsia    Pre-operative Diagnosis: Previous Cesarean Section; mild pre-eclampsia  Post-operative Diagnosis: Same   Surgeon: Surgeons and Role:    * Amado Nash Candace Gallus, MD - Primary  Procedure: rltcs Assistants: Shea Evans, MD  Anesthesia: spinal   Procedure Details:  The patient was seen in the Holding Room. The risks, benefits, complications, treatment options, and expected outcomes were discussed with the patient. The patient concurred with the proposed plan, giving informed consent. identified as Renee Mcintyre and the procedure verified as C-Section Delivery. A Time Out was held and the above information confirmed.  After induction of anesthesia, the patient was draped and prepped in the usual sterile manner, foley was draining urine well.  A pfannenstiel incision was made and carried down through the subcutaneous tissue to the fascia. Fascial incision was made and extended transversely. The fascia was separated from the underlying rectus tissue superiorly and inferiorly. The peritoneum was identified and entered. Peritoneal incision was extended longitudinally. Alexis-O retractor placed. The utero-vesical peritoneal reflection was incised transversely and the bladder flap was bluntly freed from the lower uterine segment. A low transverse uterine incision was made. Delivered from cephalic presentation was a viable female infant with vigorous cry. Apgar scores of 9 at one minute and 9 at five minutes. Delayed cord clamping done at 1 minute and baby handed to NICU team in attendance. Cord ph was sent. Cord blood was obtained for evaluation. The placenta was removed spontaneous, Intact and appeared normal. The uterine outline, tubes and ovaries appeared normal}. The uterine incision was closed with running locked sutures of 0 monocryl. A second imbricating layer sutured.    Hemostasis was observed. Alexis retractor removed. Peritoneal closure done with 2-0 Vicryl.  The fascia was then reapproximated with running sutures of 0Vicryl. The subcuticular closure was performed using 2-0plain gut. The skin was closed with 4-0Vicryl.   Instrument, sponge, and needle counts were correct prior the abdominal closure and were correct at the conclusion of the case.    Findings: viable female infant, apgars 9/9; normal uterus, nml bilateral ovaries/adnexa   Estimated Blood Loss: 351 per titron   Total IV Fluids:   Urine Output:  100CC OF clear urine  Specimens: placenta   Complications: no complications  Disposition: PACU - hemodynamically stable.   Maternal Condition: stable   Baby condition / location:  Couplet care / Skin to Skin  Attending Attestation: I performed the procedure.   Signed: Surgeon(s): Amado Nash Candace Gallus, MD

## 2023-10-30 NOTE — Transfer of Care (Signed)
 Immediate Anesthesia Transfer of Care Note  Patient: Renee Mcintyre  Procedure(s) Performed: REPEAT CESAREAN SECTION (Abdomen)  Patient Location: PACU  Anesthesia Type:Spinal  Level of Consciousness: awake, alert , and oriented  Airway & Oxygen Therapy: Patient Spontanous Breathing  Post-op Assessment: Report given to RN and Post -op Vital signs reviewed and stable  Post vital signs: Reviewed and stable  Last Vitals:  Vitals Value Taken Time  BP 142/79 10/30/23 1622  Temp 37.1 C 10/30/23 1622  Pulse 82 10/30/23 1629  Resp 19 10/30/23 1629  SpO2 93 % 10/30/23 1629  Vitals shown include unfiled device data.  Last Pain:  Vitals:   10/30/23 1622  TempSrc: Oral  PainSc:          Complications: No notable events documented.

## 2023-10-30 NOTE — Anesthesia Preprocedure Evaluation (Addendum)
 Anesthesia Evaluation  Patient identified by MRN, date of birth, ID band Patient awake    Reviewed: Allergy & Precautions, NPO status , Patient's Chart, lab work & pertinent test results, reviewed documented beta blocker date and time   Airway Mallampati: III  TM Distance: >3 FB Neck ROM: Full    Dental no notable dental hx. (+) Teeth Intact, Dental Advisory Given   Pulmonary neg pulmonary ROS   Pulmonary exam normal breath sounds clear to auscultation       Cardiovascular hypertension, Pt. on home beta blockers and Pt. on medications Normal cardiovascular exam Rhythm:Regular Rate:Normal     Neuro/Psych  Headaches  negative psych ROS   GI/Hepatic negative GI ROS, Neg liver ROS,,,  Endo/Other    Class 3 obesity (BMI 46)  Renal/GU negative Renal ROS  negative genitourinary   Musculoskeletal negative musculoskeletal ROS (+)    Abdominal   Peds  Hematology negative hematology ROS (+)   Anesthesia Other Findings Repeat C/S  Reproductive/Obstetrics (+) Pregnancy                             Anesthesia Physical Anesthesia Plan  ASA: 3  Anesthesia Plan: Spinal   Post-op Pain Management:    Induction:   PONV Risk Score and Plan: Treatment may vary due to age or medical condition  Airway Management Planned: Natural Airway  Additional Equipment:   Intra-op Plan:   Post-operative Plan:   Informed Consent: I have reviewed the patients History and Physical, chart, labs and discussed the procedure including the risks, benefits and alternatives for the proposed anesthesia with the patient or authorized representative who has indicated his/her understanding and acceptance.     Dental advisory given  Plan Discussed with: CRNA  Anesthesia Plan Comments:        Anesthesia Quick Evaluation

## 2023-10-30 NOTE — Anesthesia Procedure Notes (Signed)
 Spinal  Patient location during procedure: OR Start time: 10/30/2023 2:32 PM End time: 10/30/2023 2:35 PM Reason for block: surgical anesthesia Staffing Performed: anesthesiologist  Anesthesiologist: Elmer Picker, MD Performed by: Elmer Picker, MD Authorized by: Elmer Picker, MD   Preanesthetic Checklist Completed: patient identified, IV checked, risks and benefits discussed, surgical consent, monitors and equipment checked, pre-op evaluation and timeout performed Spinal Block Patient position: sitting Prep: DuraPrep and site prepped and draped Patient monitoring: cardiac monitor, continuous pulse ox and blood pressure Approach: midline Location: L3-4 Injection technique: single-shot Needle Needle type: Pencan  Needle gauge: 24 G Needle length: 9 cm Assessment Sensory level: T6 Events: CSF return Additional Notes Functioning IV was confirmed and monitors were applied. Sterile prep and drape, including hand hygiene and sterile gloves were used. The patient was positioned and the spine was prepped. The skin was anesthetized with lidocaine.  Free flow of clear CSF was obtained prior to injecting local anesthetic into the CSF.  The spinal needle aspirated freely following injection.  The needle was carefully withdrawn.  The patient tolerated the procedure well.

## 2023-10-30 NOTE — MAU Note (Signed)
 Renee Mcintyre is a 28 y.o. at [redacted]w[redacted]d here in MAU reporting: pt sent from office BP elevated, has a HA and some dizziness.  Is for repeat c/s.  Hx of pre-eclampsia. Denies pain, bleeding or LOF. Reports +FM.  Onset of complaint: today, did not take anything for HA. Pain score: HA 4/10 There were no vitals filed for this visit.   ZOX:WRUEAV to rm Lab orders placed from triage:    Last food 2100 3/17, fluids:0700 water

## 2023-10-31 ENCOUNTER — Other Ambulatory Visit: Payer: Self-pay

## 2023-10-31 LAB — CBC
HCT: 27.9 % — ABNORMAL LOW (ref 36.0–46.0)
Hemoglobin: 9.4 g/dL — ABNORMAL LOW (ref 12.0–15.0)
MCH: 30.4 pg (ref 26.0–34.0)
MCHC: 33.7 g/dL (ref 30.0–36.0)
MCV: 90.3 fL (ref 80.0–100.0)
Platelets: 173 10*3/uL (ref 150–400)
RBC: 3.09 MIL/uL — ABNORMAL LOW (ref 3.87–5.11)
RDW: 14.4 % (ref 11.5–15.5)
WBC: 13.3 10*3/uL — ABNORMAL HIGH (ref 4.0–10.5)
nRBC: 0 % (ref 0.0–0.2)

## 2023-10-31 MED ORDER — DIPHENHYDRAMINE HCL 50 MG/ML IJ SOLN: 12.5000 mg | INTRAMUSCULAR | Status: DC | PRN

## 2023-10-31 MED ORDER — RHO D IMMUNE GLOBULIN 1500 UNIT/2ML IJ SOSY
300.0000 ug | PREFILLED_SYRINGE | Freq: Once | INTRAMUSCULAR | Status: AC
  Administered 2023-10-31: 300 ug via INTRAVENOUS
  Filled 2023-10-31: qty 2

## 2023-10-31 MED ORDER — ACETAMINOPHEN 500 MG PO TABS: 1000.0000 mg | ORAL_TABLET | Freq: Four times a day (QID) | ORAL | Status: DC

## 2023-10-31 MED ORDER — NALOXONE HCL 4 MG/10ML IJ SOLN: 1.0000 ug/kg/h | INTRAVENOUS | Status: DC | PRN

## 2023-10-31 MED ORDER — NALOXONE HCL 0.4 MG/ML IJ SOLN: 0.4000 mg | INTRAMUSCULAR | Status: DC | PRN

## 2023-10-31 MED ORDER — SODIUM CHLORIDE 0.9% FLUSH: 3.0000 mL | INTRAVENOUS | Status: DC | PRN

## 2023-10-31 MED ORDER — KETOROLAC TROMETHAMINE 30 MG/ML IJ SOLN: 30.0000 mg | Freq: Four times a day (QID) | INTRAMUSCULAR | Status: DC | PRN

## 2023-10-31 MED ORDER — ONDANSETRON HCL 4 MG/2ML IJ SOLN: 4.0000 mg | Freq: Three times a day (TID) | INTRAMUSCULAR | Status: DC | PRN

## 2023-10-31 MED ORDER — DIPHENHYDRAMINE HCL 25 MG PO CAPS: 25.0000 mg | ORAL_CAPSULE | ORAL | Status: DC | PRN

## 2023-10-31 MED ORDER — SCOPOLAMINE 1 MG/3DAYS TD PT72: 1.0000 | MEDICATED_PATCH | Freq: Once | TRANSDERMAL | Status: DC

## 2023-10-31 NOTE — Lactation Note (Signed)
 This note was copied from a baby's chart. Lactation Consultation Note  Patient Name: Renee Mcintyre [id number] Date: 10/31/2023 Age:28 hours Reason for consult: Initial assessment;Exclusive pumping and bottle feeding;Early term 37-38.6wks  LC in to visit with P2 Mom of ET infant "Renee Mcintyre" delivered by C/Section.  Baby is at a 3% weight loss and Mom is choosing to pump and bottle feed as she did with his older sister for a year.  Mom using her pumping bra and size 24 mm flanges.  LC changed the flanges to 21 mm for a better fit.  Mom has the colostrum protectors in place and reminded her to discard them after 24 hrs of use.  Mom encouraged to pump every 3 hrs or when baby is feeding by bottle, using the initiation setting until volume is >20 ml.  Encouraged STS with baby to enhance her milk producing hormones.    Mom aware of lactation support while baby is IP and aware of OP lactation support.  Maternal Data Has patient been taught Hand Expression?: Yes Does the patient have breastfeeding experience prior to this delivery?: Yes How long did the patient breastfeed?: 1 year  Feeding Mother's Current Feeding Choice: Breast Milk and Formula  Lactation Tools Discussed/Used Tools: Pump;Flanges;Bottle Flange Size: 21 Breast pump type: Double-Electric Breast Pump Pump Education: Setup, frequency, and cleaning;Milk Storage Reason for Pumping: Support milk supply Pumping frequency: every 3 hrs Pumped volume: 0 mL (drops)  Interventions Interventions: Breast feeding basics reviewed;Skin to skin;Breast massage;Hand express;DEBP;Education;LC Services brochure;CDC Guidelines for Breast Pump Cleaning  Discharge Pump: Personal;DEBP (Spectra DEBP through insurance)  Consult Status Consult Status: Follow-up Date: 11/01/23 Follow-up type: In-patient    Judee Clara 10/31/2023, 10:52 AM

## 2023-10-31 NOTE — Progress Notes (Signed)
 No c/o; pain controlled, +flatus, tol po; ambulating Nml lochia Breastfeeding No h/a, vision changes, ruq pain   Patient Vitals for the past 24 hrs:  BP Temp Temp src Pulse Resp SpO2 Height Weight  10/31/23 0746 -- -- -- -- 17 -- -- --  10/31/23 0609 134/75 97.8 F (36.6 C) Oral 78 16 100 % -- --  10/31/23 0200 139/80 -- -- -- -- -- -- --  10/30/23 2327 (!) 152/75 98.5 F (36.9 C) Oral 87 17 95 % -- --  10/30/23 2025 -- -- -- -- -- 95 % -- --  10/30/23 2024 (!) 159/83 -- -- 75 -- -- -- --  10/30/23 1930 (!) 144/80 -- -- 80 -- -- -- --  10/30/23 1900 (!) 141/79 98.9 F (37.2 C) Oral 78 17 99 % -- --  10/30/23 1831 (!) 141/78 98.6 F (37 C) Oral 81 16 96 % -- --  10/30/23 1743 (!) 147/79 98 F (36.7 C) Oral 80 14 96 % -- --  10/30/23 1730 (!) 142/83 -- -- 78 -- 95 % -- --  10/30/23 1715 (!) 142/89 -- -- 82 14 94 % -- --  10/30/23 1700 135/87 -- -- 80 16 93 % -- --  10/30/23 1645 130/88 -- -- 75 15 93 % -- --  10/30/23 1630 (!) 115/105 -- -- 82 19 93 % -- --  10/30/23 1622 (!) 142/79 98.8 F (37.1 C) Oral -- -- 93 % -- --  10/30/23 1345 (!) 146/96 -- -- (!) 104 -- 97 % -- --  10/30/23 1330 -- -- -- -- -- 94 % -- --  10/30/23 1325 -- -- -- -- -- 97 % -- --  10/30/23 1320 -- -- -- -- -- 97 % -- --  10/30/23 1315 -- -- -- -- -- 97 % -- --  10/30/23 1310 -- -- -- -- -- 98 % -- --  10/30/23 1306 (!) 157/99 -- -- 96 -- -- -- --  10/30/23 1305 -- -- -- -- -- 97 % -- --  10/30/23 1300 -- -- -- -- -- 97 % -- --  10/30/23 1255 -- -- -- -- -- 97 % -- --  10/30/23 1251 (!) 164/97 -- -- 100 -- -- -- --  10/30/23 1250 -- -- -- -- -- 96 % -- --  10/30/23 1245 -- -- -- -- -- 96 % -- --  10/30/23 1240 -- -- -- -- -- 96 % -- --  10/30/23 1235 (!) 151/89 -- -- (!) 105 -- 94 % -- --  10/30/23 1225 -- -- -- -- -- 97 % -- --  10/30/23 1220 -- -- -- -- -- 97 % -- --  10/30/23 1215 -- -- -- -- -- 97 % -- --  10/30/23 1214 -- -- -- -- -- 97 % -- --  10/30/23 1213 (!) 150/95 -- -- 93 -- -- -- --   10/30/23 1210 -- -- -- -- -- 97 % -- --  10/30/23 1205 -- -- -- -- -- 97 % -- --  10/30/23 1200 -- -- -- -- -- 97 % -- --  10/30/23 1155 -- -- -- -- -- 97 % -- --  10/30/23 1150 -- -- -- -- -- 97 % -- --  10/30/23 1145 (!) 155/93 -- -- 94 -- 97 % -- --  10/30/23 1144 (!) 155/93 98.4 F (36.9 C) Oral 98 18 97 % -- --  10/30/23 1141 -- -- -- -- -- -- 5\' 5"  (1.651 m) 124.1  kg    Intake/Output Summary (Last 24 hours) at 10/31/2023 0814 Last data filed at 10/31/2023 0746 Gross per 24 hour  Intake 1699.63 ml  Output 1533 ml  Net 166.63 ml   A&Ox3 Rrr Ctab Abd: soft,nt,nd; +bs; dressing in place, fundus firm and below umb LE: +1 edema,nt bilat     Latest Ref Rng & Units 10/31/2023    5:45 AM 10/30/2023   12:08 PM 02/19/2021    3:56 AM  CBC  WBC 4.0 - 10.5 K/uL 13.3  11.3  14.2   Hemoglobin 12.0 - 15.0 g/dL 9.4  63.8  75.6   Hematocrit 36.0 - 46.0 % 27.9  34.5  36.9   Platelets 150 - 400 K/uL 173  201  205    Pod1 s/p rltcs Doing well Mild pre-eclampsia: bps initially mild range but now normal sine delivery - not currently on meds and will follow closely Anemia of pregnancy with abl - asymptoamtic; plan iron daily Bmi >40 Rubella NI - mmr before d/c home RH neg - rhogam pending baby

## 2023-10-31 NOTE — Anesthesia Postprocedure Evaluation (Signed)
 Anesthesia Post Note  Patient: Gladie Gravette  Procedure(s) Performed: REPEAT CESAREAN SECTION (Abdomen)     Patient location during evaluation: PACU Anesthesia Type: Spinal Level of consciousness: oriented and awake and alert Pain management: pain level controlled Vital Signs Assessment: post-procedure vital signs reviewed and stable Respiratory status: spontaneous breathing, respiratory function stable and patient connected to nasal cannula oxygen Cardiovascular status: blood pressure returned to baseline and stable Postop Assessment: no headache, no backache and no apparent nausea or vomiting Anesthetic complications: no  No notable events documented.  Last Vitals:  Vitals:   10/31/23 0746 10/31/23 1033  BP:  126/72  Pulse:  80  Resp: 17 18  Temp:  36.7 C  SpO2:  98%    Last Pain:  Vitals:   10/31/23 1033  TempSrc: Oral  PainSc:                  Thecla Forgione L Elane Peabody

## 2023-11-01 LAB — RH IG WORKUP (INCLUDES ABO/RH)
Fetal Screen: NEGATIVE
Gestational Age(Wks): 37
Unit division: 0

## 2023-11-01 MED ORDER — MEASLES, MUMPS & RUBELLA VAC IJ SOLR: 0.5000 mL | Freq: Once | INTRAMUSCULAR | Status: DC

## 2023-11-01 MED ORDER — IBUPROFEN 200 MG PO TABS
600.0000 mg | ORAL_TABLET | Freq: Four times a day (QID) | ORAL | Status: AC
Start: 2023-11-01 — End: 2023-11-08

## 2023-11-01 MED ORDER — POLYETHYLENE GLYCOL 3350 17 G PO PACK: 17.0000 g | PACK | Freq: Every day | ORAL | Status: AC

## 2023-11-01 MED ORDER — ACETAMINOPHEN 500 MG PO TABS
1000.0000 mg | ORAL_TABLET | Freq: Four times a day (QID) | ORAL | Status: AC
Start: 2023-11-01 — End: 2023-11-08

## 2023-11-01 NOTE — Discharge Summary (Signed)
 Postpartum Discharge Summary  Patient Name: Renee Mcintyre DOB: Sep 10, 1995 MRN: [id number]  Date of admission: 10/30/2023 Delivery date:10/30/2023 Delivering provider: Rhoderick Moody E Date of discharge: 11/01/2023  Admitting diagnosis: Previous cesarean section [Z98.891] Intrauterine pregnancy: [redacted]w[redacted]d     Secondary diagnosis:  Principal Problem:   Previous cesarean section  Additional problems: Preeclampsia without severe features    Discharge diagnosis: Term Pregnancy Delivered and Gestational Hypertension                                              Post partum procedures:rhogam Augmentation: N/A Complications: None  Hospital course: Sceduled C/S   28 y.o. yo G2P1102 at [redacted]w[redacted]d was admitted to the hospital 10/30/2023 for scheduled cesarean section with the following indication:Elective Repeat.Delivery details are as follows:  Membrane Rupture Time/Date: 3:04 PM,10/30/2023  Delivery Method:C-Section, Low Transverse Operative Delivery:N/A Details of operation can be found in separate operative note.  Patient had a postpartum course complicated by PEC w/o SF.  She is ambulating, tolerating a regular diet, passing flatus, and urinating well. Patient is discharged home in stable condition on  11/01/23        Newborn Data: Birth date:10/30/2023 Birth time:3:05 PM Gender:Female Living status:Living Apgars:9 ,9  Weight:4540 g    Magnesium Sulfate received: No BMZ received: No Rhophylac:Yes MMR:Yes T-DaP:Given prenatally Flu: Yes RSV Vaccine received: No Transfusion:No Immunizations administered: Immunization History  Administered Date(s) Administered   Tdap 02/13/2021    Physical exam  Vitals:   10/31/23 1033 10/31/23 1615 10/31/23 2230 11/01/23 0522  BP: 126/72 136/74 (!) 141/74 134/75  Pulse: 80 92 88 86  Resp: 18 17 18 17   Temp: 98.1 F (36.7 C) 98.1 F (36.7 C) 97.9 F (36.6 C) 98 F (36.7 C)  TempSrc: Oral Oral Oral Oral  SpO2: 98% 97% 97% 97%  Weight:       Height:       General: alert, cooperative, and no distress Lochia: appropriate Uterine Fundus: firm Incision: Healing well with no significant drainage DVT Evaluation: No evidence of DVT seen on physical exam. Labs: Lab Results  Component Value Date   WBC 13.3 (H) 10/31/2023   HGB 9.4 (L) 10/31/2023   HCT 27.9 (L) 10/31/2023   MCV 90.3 10/31/2023   PLT 173 10/31/2023      Latest Ref Rng & Units 10/30/2023   12:08 PM  CMP  Glucose 70 - 99 mg/dL 96   BUN 6 - 20 mg/dL 6   Creatinine 7.82 - 9.56 mg/dL 2.13   Sodium 086 - 578 mmol/L 136   Potassium 3.5 - 5.1 mmol/L 3.7   Chloride 98 - 111 mmol/L 108   CO2 22 - 32 mmol/L 19   Calcium 8.9 - 10.3 mg/dL 8.5   Total Protein 6.5 - 8.1 g/dL 6.1   Total Bilirubin 0.0 - 1.2 mg/dL 0.4   Alkaline Phos 38 - 126 U/L 148   AST 15 - 41 U/L 21   ALT 0 - 44 U/L 16    Edinburgh Score:    10/31/2023    9:47 AM  Edinburgh Postnatal Depression Scale Screening Tool  I have been able to laugh and see the funny side of things. 0  I have looked forward with enjoyment to things. 0  I have blamed myself unnecessarily when things went wrong. 0  I have been anxious or worried for no  good reason. 0  I have felt scared or panicky for no good reason. 1  Things have been getting on top of me. 0  I have been so unhappy that I have had difficulty sleeping. 0  I have felt sad or miserable. 0  I have been so unhappy that I have been crying. 0  The thought of harming myself has occurred to me. 0  Edinburgh Postnatal Depression Scale Total 1      After visit meds:  Allergies as of 11/01/2023   No Known Allergies      Medication List     STOP taking these medications    aspirin EC 81 MG tablet   Drospirenone 4 MG Tabs   famotidine 10 MG tablet Commonly known as: PEPCID   labetalol 300 MG tablet Commonly known as: NORMODYNE   NIFEdipine 60 MG 24 hr tablet Commonly known as: ADALAT CC   oxyCODONE 5 MG immediate release tablet Commonly  known as: Oxy IR/ROXICODONE   prenatal multivitamin Tabs tablet   pseudoephedrine 60 MG tablet Commonly known as: SUDAFED       TAKE these medications    acetaminophen 500 MG tablet Commonly known as: TYLENOL Take 2 tablets (1,000 mg total) by mouth every 6 (six) hours for 7 days.   cetirizine 10 MG tablet Commonly known as: ZyrTEC Allergy Take 1 tablet (10 mg total) by mouth daily.   fluticasone 50 MCG/ACT nasal spray Commonly known as: FLONASE Place 2 sprays into both nostrils daily.   ibuprofen 200 MG tablet Commonly known as: Advil Take 3 tablets (600 mg total) by mouth every 6 (six) hours for 7 days.   polyethylene glycol 17 g packet Commonly known as: MiraLax Take 17 g by mouth daily.         Discharge home in stable condition Infant Feeding: Bottle and Breast Infant Disposition:home with mother Discharge instruction: per After Visit Summary and Postpartum booklet. Activity: Advance as tolerated. Pelvic rest for 6 weeks.  Diet: routine diet Anticipated Birth Control: Unsure Postpartum Appointment:6 weeks Additional Postpartum F/U: Incision check 1 week and BP check 1 week Future Appointments:No future appointments. Follow up Visit:  Follow-up Information     Obgyn, [facility]. Schedule an appointment as soon as possible for a visit in 1 week(s).   Why: BP check and Provena wound vac removal Contact information: [address] [place] [address] [phone]                     11/01/2023 Edger House, MD

## 2023-11-01 NOTE — Progress Notes (Addendum)
 Postpartum Progress Note  POD#2 s/p  2' RCS  S: Patient seen and examined at bedside. Reports feeling overall well. Pain well controlled, ambulating, tolerating regular diet, voiding without issue. Passing flatus and had a bowel movement. Denies fever, chills, headache, visual changes, chest pain, shortness of breath, RUQ pain, or new/worsening LEE.   Feeding: plans to breastfeed, plans to bottle feed Circ: S/p circumcision  O:  Vitals:   10/31/23 2230 11/01/23 0522  BP:  134/75  Pulse: 88 86  Resp: 18 17  Temp: 97.9 F (36.6 C) 98 F (36.7 C)  SpO2: 97% 97%    PE:  GA: well appearing, NAD Chest: normal work of breathing on room air Abd: soft, appropriately tender, fundus firm below umbilicus Inc: dressing in place, clean/dry/intact, Provena in place Peri: moderate lochia Ext: no TTP, +1 non-pitting edema  Labs:  Lab Results  Component Value Date   WBC 13.3 (H) 10/31/2023   HGB 9.4 (L) 10/31/2023   HCT 27.9 (L) 10/31/2023   MCV 90.3 10/31/2023   PLT 173 10/31/2023   Lab Results  Component Value Date   CREATININE 0.67 10/30/2023    A/P:  28 y.o.yo [id number] POD#2 s/p  2' RCS (EBL ) with preeclampsia without severe features, doing well and progressing appropriately. Vitals within normal limits. Physical exam benign. Labs normal. Plan as follows:   #Routine OB - Regular diet, HLIV - ERAS for pain control  - Rh negative, Rhogam given - DVT ppx: ambulating - BCM: Unsure  #Neonate - S/p circumcision -plans to breastfeed, plans to bottle feed  #PEC w/o SF - BP's normal to low mild range - Plan 1wk follow-up in office - Plan BP checks daily at home   Anticipate discharge home today.   Marlene Bast, MD

## 2023-11-01 NOTE — Discharge Instructions (Signed)
 Congratulations! We hope you have a wonderful postpartum period. We will plan to see you in the office in 2-3 days for BP check and 6 weeks. Please call the office if you experience fevers (>100.4 F), heavy vaginal bleeding (using >2 pads/hour), severe pain not responsive to ibuprofen (Advil or Motrin) and acetaminophen (Tylenol), chest pain, shortness of breath, or if you have pain, redness, or swelling in one or both legs. Mood swings, fatigue, and feeling down can be normal in the first two weeks following birth. If you feel your mood symptoms are severe or lasting longer than two weeks, please call our office. If you have any thoughts of hurting yourself or others please call our office. Please call your pediatrician if you have any concerns about your baby.   Please call the office if you notice new redness, pain, swelling, or drainage around the incision.   Please take your blood pressure twice a day, morning and evening, and record your results. Please call the office if your blood pressure is >160 on the top number or >110 on the bottom number. Please also call the office if you experience headaches not responsive to medication, flashing lights in your vision, chest pain, shortness of breath, right upper quadrant pain, or new/worsening/severe lower leg swelling.

## 2023-11-01 NOTE — Progress Notes (Signed)
Discharge instructions reviewed with patient and SO. Pt verbalized understanding. Infant placed in car seat per parent. ID bands matched, umbilical clamp and security tag removed. Patient and infant discharged home in stable condition with all personal belongings. Pt requested to walk out to car.

## 2023-11-02 DIAGNOSIS — O135 Gestational [pregnancy-induced] hypertension without significant proteinuria, complicating the puerperium: Secondary | ICD-10-CM | POA: Diagnosis not present

## 2023-11-05 ENCOUNTER — Encounter (HOSPITAL_COMMUNITY)
Admission: RE | Admit: 2023-11-05 | Discharge: 2023-11-05 | Disposition: A | Discharge status: Home / Self Care | Source: Ambulatory Visit

## 2023-11-06 DIAGNOSIS — Z4801 Encounter for change or removal of surgical wound dressing: Secondary | ICD-10-CM | POA: Diagnosis not present

## 2023-11-06 DIAGNOSIS — O1495 Unspecified pre-eclampsia, complicating the puerperium: Secondary | ICD-10-CM | POA: Diagnosis not present

## 2023-11-06 DIAGNOSIS — R300 Dysuria: Secondary | ICD-10-CM | POA: Diagnosis not present

## 2023-11-06 DIAGNOSIS — O139 Gestational [pregnancy-induced] hypertension without significant proteinuria, unspecified trimester: Secondary | ICD-10-CM | POA: Diagnosis not present

## 2023-11-08 DIAGNOSIS — Z8759 Personal history of other complications of pregnancy, childbirth and the puerperium: Secondary | ICD-10-CM | POA: Diagnosis not present

## 2023-11-09 DIAGNOSIS — Z1331 Encounter for screening for depression: Secondary | ICD-10-CM | POA: Diagnosis not present

## 2023-11-09 DIAGNOSIS — O1494 Unspecified pre-eclampsia, complicating childbirth: Secondary | ICD-10-CM | POA: Diagnosis not present

## 2023-11-09 DIAGNOSIS — Z4801 Encounter for change or removal of surgical wound dressing: Secondary | ICD-10-CM | POA: Diagnosis not present

## 2023-11-09 DIAGNOSIS — R300 Dysuria: Secondary | ICD-10-CM | POA: Diagnosis not present

## 2023-11-09 MED FILL — Oxytocin Inj 10 Unit/ML: INTRAMUSCULAR | Qty: 3 | Status: AC

## 2023-11-10 ENCOUNTER — Telehealth (HOSPITAL_COMMUNITY): Payer: Self-pay

## 2023-11-10 NOTE — Telephone Encounter (Signed)
 11/10/2023 [id number]  Name: Renee Mcintyre MRN: [id number] DOB: 01/31/1996  Reason for Call:  Transition of Care Hospital Discharge Call  Contact Status: Patient Contact Status: Complete  Language assistant needed:          Follow-Up Questions: Do You Have Any Concerns About Your Health As You Heal From Delivery?: No Do You Have Any Concerns About Your Infants Health?: No  Edinburgh Postnatal Depression Scale:  In the Past 7 Days:    PHQ2-9 Depression Scale:     Discharge Follow-up: Edinburgh score requires follow up?:  (RN explained EPDS. Patient states that she will call back to complete EPDS when it is a good time for her. She states that she is doing ok emotionally.)  Post-discharge interventions: Reviewed Newborn Safe Sleep Practices  Signature  Signe Colt

## 2023-11-20 DIAGNOSIS — O1495 Unspecified pre-eclampsia, complicating the puerperium: Secondary | ICD-10-CM | POA: Diagnosis not present

## 2023-12-13 DIAGNOSIS — O164 Unspecified maternal hypertension, complicating childbirth: Secondary | ICD-10-CM | POA: Diagnosis not present

## 2023-12-13 DIAGNOSIS — Z1331 Encounter for screening for depression: Secondary | ICD-10-CM | POA: Diagnosis not present

## 2024-01-15 DIAGNOSIS — O165 Unspecified maternal hypertension, complicating the puerperium: Secondary | ICD-10-CM | POA: Diagnosis not present

## 2024-01-15 DIAGNOSIS — E282 Polycystic ovarian syndrome: Secondary | ICD-10-CM | POA: Diagnosis not present

## 2024-01-15 DIAGNOSIS — Z0000 Encounter for general adult medical examination without abnormal findings: Secondary | ICD-10-CM | POA: Diagnosis not present

## 2024-01-15 DIAGNOSIS — R945 Abnormal results of liver function studies: Secondary | ICD-10-CM | POA: Diagnosis not present

## 2024-03-19 DIAGNOSIS — D225 Melanocytic nevi of trunk: Secondary | ICD-10-CM | POA: Diagnosis not present

## 2024-03-19 DIAGNOSIS — Z1283 Encounter for screening for malignant neoplasm of skin: Secondary | ICD-10-CM | POA: Diagnosis not present

## 2024-03-19 DIAGNOSIS — D485 Neoplasm of uncertain behavior of skin: Secondary | ICD-10-CM | POA: Diagnosis not present

## 2024-04-08 DIAGNOSIS — L988 Other specified disorders of the skin and subcutaneous tissue: Secondary | ICD-10-CM | POA: Diagnosis not present

## 2024-04-08 DIAGNOSIS — D485 Neoplasm of uncertain behavior of skin: Secondary | ICD-10-CM | POA: Diagnosis not present

## 2024-04-17 DIAGNOSIS — I10 Essential (primary) hypertension: Secondary | ICD-10-CM | POA: Diagnosis not present

## 2024-04-17 DIAGNOSIS — Z23 Encounter for immunization: Secondary | ICD-10-CM | POA: Diagnosis not present

## 2024-07-25 DIAGNOSIS — I10 Essential (primary) hypertension: Secondary | ICD-10-CM | POA: Diagnosis not present

## 2024-07-25 DIAGNOSIS — E282 Polycystic ovarian syndrome: Secondary | ICD-10-CM | POA: Diagnosis not present
